# Patient Record
Sex: Male | Born: 1950 | Race: White | Hispanic: No | Smoking: Former smoker
Health system: Southern US, Community
[De-identification: ages and names within clinical notes are randomized; demographics above are authoritative.]

## PROBLEM LIST (undated history)

## (undated) DIAGNOSIS — E7800 Pure hypercholesterolemia, unspecified: Secondary | ICD-10-CM

## (undated) DIAGNOSIS — K219 Gastro-esophageal reflux disease without esophagitis: Secondary | ICD-10-CM

## (undated) DIAGNOSIS — I739 Peripheral vascular disease, unspecified: Secondary | ICD-10-CM

## (undated) DIAGNOSIS — M1990 Unspecified osteoarthritis, unspecified site: Secondary | ICD-10-CM

## (undated) DIAGNOSIS — L719 Rosacea, unspecified: Secondary | ICD-10-CM

## (undated) DIAGNOSIS — I10 Essential (primary) hypertension: Secondary | ICD-10-CM

## (undated) DIAGNOSIS — E119 Type 2 diabetes mellitus without complications: Secondary | ICD-10-CM

## (undated) DIAGNOSIS — Z8719 Personal history of other diseases of the digestive system: Secondary | ICD-10-CM

## (undated) DIAGNOSIS — T7840XA Allergy, unspecified, initial encounter: Secondary | ICD-10-CM

## (undated) DIAGNOSIS — M810 Age-related osteoporosis without current pathological fracture: Secondary | ICD-10-CM

## (undated) HISTORY — PX: BACK SURGERY: SHX140

## (undated) HISTORY — PX: CERVICAL SPINE SURGERY: SHX589

## (undated) HISTORY — DX: Allergy, unspecified, initial encounter: T78.40XA

## (undated) HISTORY — DX: Rosacea, unspecified: L71.9

## (undated) HISTORY — PX: COLONOSCOPY: SHX5424

## (undated) HISTORY — PX: ESOPHAGOGASTRODUODENOSCOPY: SHX1529

## (undated) HISTORY — DX: Peripheral vascular disease, unspecified: I73.9

---

## 2000-10-13 ENCOUNTER — Encounter: Payer: Self-pay | Admitting: Family Medicine

## 2000-10-13 ENCOUNTER — Encounter: Admission: RE | Admit: 2000-10-13 | Discharge: 2000-10-13 | Payer: Self-pay | Admitting: Family Medicine

## 2001-07-13 ENCOUNTER — Encounter: Payer: Self-pay | Admitting: Family Medicine

## 2001-07-13 ENCOUNTER — Encounter: Admission: RE | Admit: 2001-07-13 | Discharge: 2001-07-13 | Payer: Self-pay | Admitting: Family Medicine

## 2002-09-25 ENCOUNTER — Ambulatory Visit (HOSPITAL_COMMUNITY): Admission: RE | Admit: 2002-09-25 | Discharge: 2002-09-25 | Payer: Self-pay | Admitting: *Deleted

## 2002-09-25 ENCOUNTER — Encounter (INDEPENDENT_AMBULATORY_CARE_PROVIDER_SITE_OTHER): Payer: Self-pay | Admitting: Specialist

## 2003-06-19 ENCOUNTER — Encounter (INDEPENDENT_AMBULATORY_CARE_PROVIDER_SITE_OTHER): Payer: Self-pay | Admitting: Internal Medicine

## 2003-06-19 LAB — CONVERTED CEMR LAB: Hgb A1c MFr Bld: 8 %

## 2003-07-19 ENCOUNTER — Encounter (INDEPENDENT_AMBULATORY_CARE_PROVIDER_SITE_OTHER): Payer: Self-pay | Admitting: Internal Medicine

## 2003-10-19 ENCOUNTER — Encounter (INDEPENDENT_AMBULATORY_CARE_PROVIDER_SITE_OTHER): Payer: Self-pay | Admitting: Internal Medicine

## 2003-10-19 LAB — CONVERTED CEMR LAB: Hgb A1c MFr Bld: 5.6 %

## 2004-04-18 ENCOUNTER — Encounter (INDEPENDENT_AMBULATORY_CARE_PROVIDER_SITE_OTHER): Payer: Self-pay | Admitting: Internal Medicine

## 2004-05-14 ENCOUNTER — Ambulatory Visit: Payer: Self-pay | Admitting: Family Medicine

## 2004-05-14 ENCOUNTER — Encounter (INDEPENDENT_AMBULATORY_CARE_PROVIDER_SITE_OTHER): Payer: Self-pay | Admitting: Internal Medicine

## 2004-05-14 LAB — CONVERTED CEMR LAB: PSA: 0.35 ng/mL

## 2004-06-05 ENCOUNTER — Ambulatory Visit: Payer: Self-pay | Admitting: Internal Medicine

## 2004-09-30 ENCOUNTER — Ambulatory Visit: Payer: Self-pay | Admitting: Family Medicine

## 2004-09-30 ENCOUNTER — Encounter: Admission: RE | Admit: 2004-09-30 | Discharge: 2004-09-30 | Payer: Self-pay | Admitting: Family Medicine

## 2004-10-08 ENCOUNTER — Encounter: Admission: RE | Admit: 2004-10-08 | Discharge: 2004-10-08 | Payer: Self-pay | Admitting: Family Medicine

## 2004-10-09 ENCOUNTER — Encounter: Admission: RE | Admit: 2004-10-09 | Discharge: 2004-10-09 | Payer: Self-pay | Admitting: Family Medicine

## 2004-10-18 ENCOUNTER — Encounter (INDEPENDENT_AMBULATORY_CARE_PROVIDER_SITE_OTHER): Payer: Self-pay | Admitting: Internal Medicine

## 2004-11-12 ENCOUNTER — Ambulatory Visit: Payer: Self-pay | Admitting: Family Medicine

## 2004-12-01 ENCOUNTER — Ambulatory Visit: Payer: Self-pay | Admitting: Family Medicine

## 2005-04-18 ENCOUNTER — Encounter (INDEPENDENT_AMBULATORY_CARE_PROVIDER_SITE_OTHER): Payer: Self-pay | Admitting: Internal Medicine

## 2005-04-18 LAB — CONVERTED CEMR LAB: Hgb A1c MFr Bld: 5.9 %

## 2005-05-03 ENCOUNTER — Ambulatory Visit: Payer: Self-pay | Admitting: Family Medicine

## 2005-08-05 ENCOUNTER — Ambulatory Visit (HOSPITAL_COMMUNITY): Admission: RE | Admit: 2005-08-05 | Discharge: 2005-08-06 | Payer: Self-pay | Admitting: Neurosurgery

## 2005-10-04 ENCOUNTER — Inpatient Hospital Stay (HOSPITAL_COMMUNITY): Admission: RE | Admit: 2005-10-04 | Discharge: 2005-10-08 | Payer: Self-pay | Admitting: Neurosurgery

## 2005-10-18 ENCOUNTER — Encounter (INDEPENDENT_AMBULATORY_CARE_PROVIDER_SITE_OTHER): Payer: Self-pay | Admitting: Internal Medicine

## 2005-10-21 ENCOUNTER — Ambulatory Visit: Payer: Self-pay | Admitting: Family Medicine

## 2006-01-03 ENCOUNTER — Ambulatory Visit: Payer: Self-pay | Admitting: Family Medicine

## 2006-05-10 ENCOUNTER — Ambulatory Visit: Payer: Self-pay | Admitting: Internal Medicine

## 2006-05-10 LAB — CONVERTED CEMR LAB
ALT: 47 units/L — ABNORMAL HIGH (ref 0–40)
AST: 41 units/L — ABNORMAL HIGH (ref 0–37)
BUN: 12 mg/dL (ref 6–23)
Chloride: 106 meq/L (ref 96–112)
Cholesterol: 209 mg/dL (ref 0–200)
Creatinine,U: 170.8 mg/dL
Direct LDL: 133.7 mg/dL
GFR calc Af Amer: 74 mL/min
GFR calc non Af Amer: 61 mL/min
Glucose, Bld: 151 mg/dL — ABNORMAL HIGH (ref 70–99)
Hgb A1c MFr Bld: 8.2 %
Microalb Creat Ratio: 5.9 mg/g (ref 0.0–30.0)
Potassium: 4.7 meq/L (ref 3.5–5.1)
Sodium: 141 meq/L (ref 135–145)
Triglycerides: 302 mg/dL (ref 0–149)
VLDL: 60 mg/dL — ABNORMAL HIGH (ref 0–40)

## 2006-05-11 ENCOUNTER — Ambulatory Visit: Payer: Self-pay | Admitting: Family Medicine

## 2006-05-12 ENCOUNTER — Ambulatory Visit: Payer: Self-pay | Admitting: Internal Medicine

## 2006-05-26 ENCOUNTER — Ambulatory Visit: Payer: Self-pay | Admitting: Family Medicine

## 2006-05-26 DIAGNOSIS — M810 Age-related osteoporosis without current pathological fracture: Secondary | ICD-10-CM | POA: Insufficient documentation

## 2006-06-15 ENCOUNTER — Telehealth (INDEPENDENT_AMBULATORY_CARE_PROVIDER_SITE_OTHER): Payer: Self-pay | Admitting: *Deleted

## 2006-07-11 ENCOUNTER — Encounter (INDEPENDENT_AMBULATORY_CARE_PROVIDER_SITE_OTHER): Payer: Self-pay | Admitting: Internal Medicine

## 2006-07-11 DIAGNOSIS — E119 Type 2 diabetes mellitus without complications: Secondary | ICD-10-CM

## 2006-07-11 DIAGNOSIS — M5430 Sciatica, unspecified side: Secondary | ICD-10-CM

## 2006-07-11 DIAGNOSIS — E785 Hyperlipidemia, unspecified: Secondary | ICD-10-CM

## 2006-07-12 ENCOUNTER — Ambulatory Visit: Payer: Self-pay | Admitting: Family Medicine

## 2006-07-12 LAB — CONVERTED CEMR LAB
Creatinine,U: 120.3 mg/dL
Microalb Creat Ratio: 5.8 mg/g (ref 0.0–30.0)

## 2006-07-20 ENCOUNTER — Telehealth (INDEPENDENT_AMBULATORY_CARE_PROVIDER_SITE_OTHER): Payer: Self-pay | Admitting: Internal Medicine

## 2006-09-01 ENCOUNTER — Ambulatory Visit: Payer: Self-pay | Admitting: Family Medicine

## 2006-09-01 DIAGNOSIS — H6980 Other specified disorders of Eustachian tube, unspecified ear: Secondary | ICD-10-CM

## 2006-10-17 ENCOUNTER — Ambulatory Visit: Payer: Self-pay | Admitting: Internal Medicine

## 2006-11-29 ENCOUNTER — Encounter (INDEPENDENT_AMBULATORY_CARE_PROVIDER_SITE_OTHER): Payer: Self-pay | Admitting: Internal Medicine

## 2006-12-02 ENCOUNTER — Ambulatory Visit: Payer: Self-pay | Admitting: Family Medicine

## 2007-01-02 ENCOUNTER — Ambulatory Visit: Payer: Self-pay | Admitting: Family Medicine

## 2007-01-05 LAB — CONVERTED CEMR LAB
Cholesterol: 197 mg/dL (ref 0–200)
HDL: 25.7 mg/dL — ABNORMAL LOW (ref >39.0)
Hgb A1c MFr Bld: 6.4 % — ABNORMAL HIGH (ref 4.6–6.0)
LDL Cholesterol: 142 mg/dL — ABNORMAL HIGH (ref 0–99)
VLDL: 29 mg/dL (ref 0–40)

## 2007-01-09 ENCOUNTER — Ambulatory Visit: Payer: Self-pay | Admitting: Family Medicine

## 2007-01-09 DIAGNOSIS — E669 Obesity, unspecified: Secondary | ICD-10-CM | POA: Insufficient documentation

## 2007-02-10 ENCOUNTER — Ambulatory Visit: Payer: Self-pay | Admitting: Family Medicine

## 2007-02-15 ENCOUNTER — Ambulatory Visit: Payer: Self-pay | Admitting: Family Medicine

## 2007-02-15 LAB — CONVERTED CEMR LAB
AST: 31 units/L (ref 0–37)
Cholesterol: 124 mg/dL (ref 0–200)
LDL Cholesterol: 75 mg/dL (ref 0–99)
Total CHOL/HDL Ratio: 4.4
Triglycerides: 105 mg/dL (ref 0–149)

## 2007-05-17 ENCOUNTER — Ambulatory Visit: Payer: Self-pay | Admitting: Family Medicine

## 2007-05-18 LAB — CONVERTED CEMR LAB
Cholesterol: 138 mg/dL (ref 0–200)
LDL Cholesterol: 84 mg/dL (ref 0–99)
Triglycerides: 153 mg/dL — ABNORMAL HIGH (ref 0–149)
VLDL: 31 mg/dL (ref 0–40)

## 2007-05-23 ENCOUNTER — Ambulatory Visit: Payer: Self-pay | Admitting: Family Medicine

## 2007-05-23 DIAGNOSIS — T887XXA Unspecified adverse effect of drug or medicament, initial encounter: Secondary | ICD-10-CM

## 2007-05-23 DIAGNOSIS — H9190 Unspecified hearing loss, unspecified ear: Secondary | ICD-10-CM | POA: Insufficient documentation

## 2007-05-30 ENCOUNTER — Encounter (INDEPENDENT_AMBULATORY_CARE_PROVIDER_SITE_OTHER): Payer: Self-pay | Admitting: Internal Medicine

## 2007-06-26 ENCOUNTER — Ambulatory Visit: Payer: Self-pay | Admitting: Family Medicine

## 2007-06-26 DIAGNOSIS — M545 Low back pain, unspecified: Secondary | ICD-10-CM | POA: Insufficient documentation

## 2007-06-26 DIAGNOSIS — M542 Cervicalgia: Secondary | ICD-10-CM

## 2007-07-03 LAB — CONVERTED CEMR LAB
ALT: 48 units/L (ref 0–53)
Cholesterol: 197 mg/dL (ref 0–200)
Direct LDL: 131.5 mg/dL
Total CHOL/HDL Ratio: 7.5

## 2007-07-04 ENCOUNTER — Ambulatory Visit: Payer: Self-pay | Admitting: Family Medicine

## 2007-09-13 ENCOUNTER — Ambulatory Visit: Payer: Self-pay | Admitting: Family Medicine

## 2007-09-18 ENCOUNTER — Encounter (INDEPENDENT_AMBULATORY_CARE_PROVIDER_SITE_OTHER): Payer: Self-pay | Admitting: Internal Medicine

## 2007-09-18 LAB — CONVERTED CEMR LAB
Cholesterol: 174 mg/dL (ref 0–200)
Total CHOL/HDL Ratio: 5.8
Triglycerides: 130 mg/dL (ref 0–149)

## 2007-09-20 ENCOUNTER — Ambulatory Visit: Payer: Self-pay | Admitting: Family Medicine

## 2007-10-11 ENCOUNTER — Encounter (INDEPENDENT_AMBULATORY_CARE_PROVIDER_SITE_OTHER): Payer: Self-pay | Admitting: Internal Medicine

## 2007-10-18 ENCOUNTER — Ambulatory Visit: Payer: Self-pay | Admitting: Family Medicine

## 2007-12-19 ENCOUNTER — Ambulatory Visit: Payer: Self-pay | Admitting: Internal Medicine

## 2007-12-19 LAB — CONVERTED CEMR LAB
Cholesterol: 114 mg/dL (ref 0–200)
HDL: 27.8 mg/dL — ABNORMAL LOW (ref >39.0)
Hgb A1c MFr Bld: 7.2 % — ABNORMAL HIGH (ref 4.6–6.0)
LDL Cholesterol: 64 mg/dL (ref 0–99)
Total CHOL/HDL Ratio: 4.1
Triglycerides: 113 mg/dL (ref 0–149)
VLDL: 23 mg/dL (ref 0–40)

## 2007-12-22 ENCOUNTER — Ambulatory Visit: Payer: Self-pay | Admitting: Internal Medicine

## 2007-12-22 DIAGNOSIS — R252 Cramp and spasm: Secondary | ICD-10-CM

## 2007-12-25 LAB — CONVERTED CEMR LAB
CO2: 31 meq/L (ref 19–32)
Calcium: 9.7 mg/dL (ref 8.4–10.5)
Chloride: 105 meq/L (ref 96–112)
Sodium: 141 meq/L (ref 135–145)

## 2008-02-01 ENCOUNTER — Telehealth (INDEPENDENT_AMBULATORY_CARE_PROVIDER_SITE_OTHER): Payer: Self-pay | Admitting: *Deleted

## 2008-02-01 ENCOUNTER — Encounter (INDEPENDENT_AMBULATORY_CARE_PROVIDER_SITE_OTHER): Payer: Self-pay | Admitting: Internal Medicine

## 2008-02-21 ENCOUNTER — Ambulatory Visit: Payer: Self-pay | Admitting: Family Medicine

## 2008-03-05 ENCOUNTER — Telehealth (INDEPENDENT_AMBULATORY_CARE_PROVIDER_SITE_OTHER): Payer: Self-pay | Admitting: Internal Medicine

## 2008-03-18 ENCOUNTER — Ambulatory Visit: Payer: Self-pay | Admitting: Family Medicine

## 2008-03-18 DIAGNOSIS — M25519 Pain in unspecified shoulder: Secondary | ICD-10-CM | POA: Insufficient documentation

## 2008-05-01 ENCOUNTER — Ambulatory Visit: Payer: Self-pay | Admitting: Family Medicine

## 2008-05-01 DIAGNOSIS — H60339 Swimmer's ear, unspecified ear: Secondary | ICD-10-CM

## 2008-05-28 ENCOUNTER — Encounter (INDEPENDENT_AMBULATORY_CARE_PROVIDER_SITE_OTHER): Payer: Self-pay | Admitting: Internal Medicine

## 2008-05-31 ENCOUNTER — Ambulatory Visit: Payer: Self-pay | Admitting: Family Medicine

## 2008-05-31 LAB — CONVERTED CEMR LAB
ALT: 35 units/L (ref 0–53)
HDL: 26.8 mg/dL — ABNORMAL LOW (ref >39.00)
LDL Cholesterol: 74 mg/dL (ref 0–99)
Total CHOL/HDL Ratio: 5
VLDL: 24.4 mg/dL (ref 0.0–40.0)

## 2008-06-04 ENCOUNTER — Ambulatory Visit: Payer: Self-pay | Admitting: Family Medicine

## 2008-06-04 DIAGNOSIS — I10 Essential (primary) hypertension: Secondary | ICD-10-CM | POA: Insufficient documentation

## 2008-06-05 ENCOUNTER — Encounter (INDEPENDENT_AMBULATORY_CARE_PROVIDER_SITE_OTHER): Payer: Self-pay | Admitting: Internal Medicine

## 2008-06-05 ENCOUNTER — Ambulatory Visit: Payer: Self-pay | Admitting: Internal Medicine

## 2008-06-14 ENCOUNTER — Encounter (INDEPENDENT_AMBULATORY_CARE_PROVIDER_SITE_OTHER): Payer: Self-pay | Admitting: Internal Medicine

## 2008-07-10 ENCOUNTER — Ambulatory Visit: Payer: Self-pay | Admitting: Family Medicine

## 2008-07-18 ENCOUNTER — Ambulatory Visit: Payer: Self-pay | Admitting: Family Medicine

## 2008-07-18 DIAGNOSIS — J020 Streptococcal pharyngitis: Secondary | ICD-10-CM

## 2008-07-18 DIAGNOSIS — H9209 Otalgia, unspecified ear: Secondary | ICD-10-CM | POA: Insufficient documentation

## 2008-07-18 LAB — CONVERTED CEMR LAB: Rapid Strep: POSITIVE

## 2008-07-19 ENCOUNTER — Telehealth (INDEPENDENT_AMBULATORY_CARE_PROVIDER_SITE_OTHER): Payer: Self-pay | Admitting: Internal Medicine

## 2008-08-09 ENCOUNTER — Ambulatory Visit: Payer: Self-pay | Admitting: Family Medicine

## 2008-09-09 ENCOUNTER — Ambulatory Visit: Payer: Self-pay | Admitting: Family Medicine

## 2008-09-11 LAB — CONVERTED CEMR LAB
ALT: 48 units/L (ref 0–53)
AST: 39 units/L — ABNORMAL HIGH (ref 0–37)
Cholesterol: 113 mg/dL (ref 0–200)
Creatinine,U: 93.8 mg/dL
HDL: 25.1 mg/dL — ABNORMAL LOW (ref >39.00)
Microalb Creat Ratio: 9.6 mg/g (ref 0.0–30.0)
Microalb, Ur: 0.9 mg/dL (ref 0.0–1.9)
Total CHOL/HDL Ratio: 5
VLDL: 29 mg/dL (ref 0.0–40.0)

## 2008-10-22 ENCOUNTER — Ambulatory Visit: Payer: Self-pay | Admitting: Family Medicine

## 2008-11-08 ENCOUNTER — Ambulatory Visit: Payer: Self-pay | Admitting: Family Medicine

## 2008-11-13 LAB — CONVERTED CEMR LAB: Hgb A1c MFr Bld: 6.2 % (ref 4.6–6.5)

## 2008-12-03 ENCOUNTER — Ambulatory Visit: Payer: Self-pay | Admitting: Family Medicine

## 2008-12-04 ENCOUNTER — Telehealth (INDEPENDENT_AMBULATORY_CARE_PROVIDER_SITE_OTHER): Payer: Self-pay | Admitting: Internal Medicine

## 2008-12-04 LAB — CONVERTED CEMR LAB
ALT: 38 units/L (ref 0–53)
HDL: 31.7 mg/dL — ABNORMAL LOW (ref >39.00)
Total CHOL/HDL Ratio: 6
Triglycerides: 196 mg/dL — ABNORMAL HIGH (ref 0.0–149.0)

## 2008-12-10 ENCOUNTER — Ambulatory Visit: Payer: Self-pay | Admitting: Family Medicine

## 2009-01-13 ENCOUNTER — Ambulatory Visit: Payer: Self-pay | Admitting: Family Medicine

## 2009-01-14 ENCOUNTER — Telehealth (INDEPENDENT_AMBULATORY_CARE_PROVIDER_SITE_OTHER): Payer: Self-pay | Admitting: Internal Medicine

## 2009-01-14 LAB — CONVERTED CEMR LAB
Alkaline Phosphatase: 59 units/L (ref 39–117)
Bilirubin, Direct: 0.1 mg/dL (ref 0.0–0.3)
Cholesterol: 175 mg/dL (ref 0–200)
HDL: 30 mg/dL — ABNORMAL LOW (ref >39.00)
Total CHOL/HDL Ratio: 6
Triglycerides: 220 mg/dL — ABNORMAL HIGH (ref 0.0–149.0)

## 2009-01-23 ENCOUNTER — Ambulatory Visit: Payer: Self-pay | Admitting: Internal Medicine

## 2009-03-24 ENCOUNTER — Telehealth (INDEPENDENT_AMBULATORY_CARE_PROVIDER_SITE_OTHER): Payer: Self-pay | Admitting: Cardiology

## 2009-06-03 ENCOUNTER — Encounter: Payer: Self-pay | Admitting: Family Medicine

## 2010-02-17 NOTE — Progress Notes (Signed)
Summary: leg cramping and difficulty in putting on socks d/t crestor  Phone Note Call from Patient   Caller: Patient Call For: lipid clinic Summary of Call: leg cramping started 3 days after starting on crestor 2.5 mg daily.  continued until 3/4 then held until today--feels much better today.   Initial call taken by: Shelby Dubin PharmD, BCPS, CPP,  March 24, 2009 9:18 AM  Follow-up for Phone Call        Offered 2.5 mg dose 2 times weekly.  Patient hesitant, but agrees to try.  Verbalizes willingness--try 2.5 mg tomorrow, then plan to talk on Friday to see how feeling.  Precautions to patient who agrees to call with worsening symptoms or changes..  May need to try co-q-10 too..mp Follow-up by: Shelby Dubin PharmD, BCPS, CPP,  March 24, 2009 9:18 AM    New/Updated Medications: CRESTOR 5 MG TABS (ROSUVASTATIN CALCIUM) Take one  half tablet by mouth each Tuesday and Friday.

## 2010-02-17 NOTE — Assessment & Plan Note (Signed)
Summary: NEW TO LIPID/HYPERLIPIDEMIA   Primary Provider:  Gildardo Griffes FNP   History of Present Illness:       This is a 60 year old male who presents for evaluation in Lipid Clinic.  The patient presents with history of hyperlipidemia, hypertension, diabetes, and smoking - he quit 7 years ago.  The patient is compliant with medication-fair.    fenofibrate - leg muscle burning pravastatin  - leg  muscle burning simvastatin - leg muscle burning metformin - leg muscle burning - on prior to statin or fenofibrate   Preventive Screening-Counseling & Management  Alcohol-Tobacco     Alcohol drinks/day: quit in 1997     Smoking Status: quit > 6 months  Caffeine-Diet-Exercise     Caffeine use/day: 3 diet dr pepper /day     Does Patient Exercise: yes     Type of exercise: walk     Exercise (avg: min/session): 1 mile daily 3 minle 2x week  Current Medications (verified): 1)  Metrogel  Gel (Metronidazole Gel) .... Apply Two Times A Day 2)  Hydrocodone-Acetaminophen 10-500 Mg Tabs (Hydrocodone-Acetaminophen) .Marland Kitchen.. 1 By Mouth Every 4 Hours As Needed By Mouth 3)  Flexeril 10 Mg Tabs (Cyclobenzaprine Hcl) .Marland Kitchen.. 1 Three Times A Day As Needed By Mouth 4)  Vitamin D3 1000 Unit Tabs (Cholecalciferol) .Marland Kitchen.. 1 Daily By Mouth 5)  Calcium 600/vitamin D 600-400 Mg-Unit  Tabs (Calcium Carbonate-Vitamin D) .... Take 1 Tablet By Mouth Two Times A Day 6)  Cvs Natural Fish Oil 1000 Mg  Caps (Omega-3 Fatty Acids) .... Take 1 Tablet By Mouth Three Times A Day 7)  Omeprazole 40 Mg Cpdr (Omeprazole) .Marland Kitchen.. 1 Each Morning With Water, Wait 30-60 Min To Eat or Drink 8)  Ramipril 2.5 Mg Caps (Ramipril) .Marland Kitchen.. 1 Once Daily For Bp By Mouth 9)  Freestyle Test Strips .... Use Daily To Monitor Diabetes 10)  Crestor 5 Mg Tabs (Rosuvastatin Calcium) .... Take One  Half Tablet By Mouth Daily.  Allergies (verified): 1)  ! Amoxicillin 2)  ! Pcn 3)  ! * Cortisporin Ear Gtts 4)  ! Percocet 5)  ! Keflex 6)  !  Ultram 7)  ! Celebrex 8)  ! * Actonel 9)  ! Actonel 10)  ! * Lisinopril  Social History: Caffeine use/day:  3 diet dr pepper /day Alcohol drinks/day:  quit in 1997 Smoking Status:  quit > 6 months Does Patient Exercise:  yes   Vital Signs:  Patient profile:   60 year old male Height:      69 inches Weight:      197 pounds Pulse rate:   80 / minute Pulse rhythm:   regular BP sitting:   158 / 96  (left arm) Cuff size:   large  Impression & Recommendations:  Problem # 1:  HYPERLIPIDEMIA (ICD-272.4)  The following medications were removed from the medication list:    Fenofibrate Micronized 134 Mg Caps (Fenofibrate micronized) .Marland Kitchen... Take 1 each day for cholesterol - d/c     Pravachol 40 Mg Tabs (Pravastatin sodium) .Marland Kitchen... Take 1 each day for cholesterol - d/c His updated medication list for this problem includes:    Crestor 5 Mg Tabs (Rosuvastatin calcium) .Marland Kitchen... Take one  half tablet by mouth daily. - start today  This is Mr Thorstenson's first visit to Lipid Clinic.  He has had many statin intolerences in the past.  All causing leg burning - not muscle cramps or pains.   He does have chrronic back  pain and DM.  I wounder if leg burning is assoc with these problems and less likely stain intol.   He exercises daily by walking 1 mile on the tredmill in .  He eats mostly low fat meals with some snacking inbetween.  His biggest problem is red meat almost daily and a bag of popcorn daily.  He is non-compliant with meds - and has been off lipid meds and ACEI and DM meds for a month.  He states metformin caused leg burning as did the statins.  He is now on no meds for DM.  I ahve asked that he return to his primary MD for follow up and treatment for DM...as we will not be able to control TG without DM control.    TC  175 at goal < 200   TG 220 > goal < 150   HDL 30 < goal > 40   LDL 104 > goal < 70  Plan - increase walking 3 min 2-3 days a week. - decrease snacks and popcorn by half -  begin crestor 2.5mg  once daily   - f/u 2 months Prescriptions: RAMIPRIL 2.5 MG CAPS (RAMIPRIL) 1 once daily for BP by mouth  #90 x 3   Entered by:   Leota Sauers, PharmD, BCPS, CPP   Authorized by:   Gaylord Shih, MD, [facility]   Signed by:   Leota Sauers, PharmD, BCPS, CPP on 01/23/2009   Method used:   Electronically to        [facility]  [address] [id number]       [address]       [place], [place]  [address]       Ph: [phone]       Fax: [phone]   RxID:   [id number] CRESTOR 5 MG TABS (ROSUVASTATIN CALCIUM) Take one  half tablet by mouth daily.  #15 x 3   Entered by:   Leota Sauers, PharmD, BCPS, CPP   Authorized by:   Gaylord Shih, MD, [facility]   Signed by:   Leota Sauers, PharmD, BCPS, CPP on 01/23/2009   Method used:   [facility] to        [id number]       [address]       [place], [place]  [address]       Ph: [phone]       Fax: [phone]   RxID:   [id number]

## 2010-02-20 NOTE — Letter (Signed)
Summary: [facility]  [facility]   Imported By: Lanelle Bal 06/13/2009 12:17:42  _____________________________________________________________________  External Attachment:    Type:   Image     Comment:   External Document

## 2010-06-05 NOTE — Op Note (Signed)
   NAME:  Blake Miller, Blake Miller                            ACCOUNT NO.:  [id number]   MEDICAL RECORD NO.:  [id number]                   PATIENT TYPE:  AMB   LOCATION:  ENDO                                 FACILITY:  [facility]   PHYSICIAN:  Georgiana Spinner, M.D.                 DATE OF BIRTH:  1950-03-11   DATE OF PROCEDURE:  09/25/2002  DATE OF DISCHARGE:                                 OPERATIVE REPORT   PROCEDURE:  Endoscopy.   INDICATIONS:  GERD with history of Barrett's esophagus.   ANESTHESIA:  Demerol 80 mg, Versed 8 mg.   DESCRIPTION OF PROCEDURE:  With the patient mildly sedated in the left  lateral decubitus position, the Olympus videoscopic endoscope was inserted  in the mouth, passed under direct vision through the esophagus, which  appeared normal.  The distal esophagus, however, was not well-visualized due  to spasm.  There were areas that could conceivably be Barrett's but could  not be well-seen.  They were photographed and biopsies were taken from this  area.  We entered into the stomach.  Fundus, body, antrum appeared normal.  The duodenal bulb showed erythema consistent with duodenitis.  The second  portion of the duodenum appeared normal.  From this point the endoscope was  then slowly withdrawn, taking circumferential views of the duodenal mucosa  until the endoscope had been pulled back into the stomach, placed into  retroflexion to view the stomach from below.  The endoscope was then  straightened and withdrawn, taking circumferential views of the remaining  gastric and esophageal mucosa.  The patient's vital signs and pulse oximetry  remained stable.  The patient tolerated the procedure well with no apparent  complications.   FINDINGS:  Duodenitis with question of Barrett's esophagus also seen.   PLAN:  Await biopsy report.  The patient will call me for results and follow  up with me as an outpatient.                                               Georgiana Spinner,  M.D.    GMO/MEDQ  D:  09/25/2002  T:  09/25/2002  Job:  [id number]   cc:   [facility], P.[facility].-C.

## 2010-06-05 NOTE — Op Note (Signed)
NAME:  Blake Miller, Blake Miller                  ACCOUNT NO.:  [id number]   MEDICAL RECORD NO.:  [id number]          PATIENT TYPE:  INP   LOCATION:  3012                         FACILITY:  [facility]   PHYSICIAN:  Cristi Loron, M.D.DATE OF BIRTH:  10-21-1950   DATE OF PROCEDURE:  DATE OF DISCHARGE:                                 OPERATIVE REPORT   ADDENDUM:   SURGEON:  Cristi Loron, M.D.   NOTE:  I forgot to mention that there was severe foraminal stenosis at L4-5  bilaterally, with compression of the bilateral exiting L4 nerve roots.  I  performed an extensive foraminotomy about the L4 nerve roots bilaterally,  decompressing the nerve well.      Cristi Loron, M.D.  Electronically Signed     JDJ/MEDQ  D:  10/05/2005  T:  10/05/2005  Job:  [id number]

## 2010-06-05 NOTE — Discharge Summary (Signed)
NAME:  Blake Miller, LINAREZ                  ACCOUNT NO.:  [id number]   MEDICAL RECORD NO.:  [id number]          PATIENT TYPE:  INP   LOCATION:  3012                         FACILITY:  [facility]   PHYSICIAN:  Cristi Loron, M.D.DATE OF BIRTH:  12-Sep-1950   DATE OF ADMISSION:  10/04/2005  DATE OF DISCHARGE:  10/08/2005                                 DISCHARGE SUMMARY   BRIEF HISTORY:  The patient is a 60 year old white male who suffered from  chronic back and leg pain.  He failed medical management and was worked up  with a lumbar MRI, which demonstrated he had a spondylolisthesis at L4-5  with a transitional-type vertebra, which I will name L5, with a resultant  severe spinal stenosis.  I discussed the various treatment options with the  patient, including surgery.  He is aware of the risk, benefits, and  alternatives to surgery and decided to proceed with a lumbar decompression  and fusion.   For further details of this admission, please refer to the type history and  physical.   HOSPITAL COURSE:  I admitted the patient to [facility] on October 04, 2005.  On the day of admission, I performed at L4-5 decompression and  fusion.  The surgery went well and for further details of this operation,  please refer to typed operative report.   The patient's postoperative course was remarkable for only he had a fever to  101.9.  A chest x-ray, UA, CBC, etc. all turned out okay, except for some  atelectasis on the chest x-ray.  The patient was discharged home on  October 08, 2005 at his request.  At that time he was afebrile, vital  signs were stable, he was eating well, and ambulating well.   DISCHARGE INSTRUCTIONS:  The patient was given written discharge  instructions and instructed to follow up with me in 4 weeks.   DISCHARGE MEDICATIONS:  1. Lortab 10, #100, one p.o. q.4 hours p.r.n. for pain.  2. Valium 5/50 mg one p.o. q.6 hours p.r.n. for muscle spasms.   FINAL DIAGNOSIS:  1. L4-5 grade 1 spondylolisthesis; L4 spondylolysis; spinal stenosis;      lumbar radiculopathy; lumbago.  2. Atelectasis.  3. Fever.   PROCEDURE PERFORMED:  L4 Gill procedure; bilateral L3 laminotomies, L4-5  posterior non-segmental instrumentation with Legacy titanium pedicle screws  and rods; L4-4 posterolateral arthrodesis, local morcellized autologous bone  and Vitoss bone graft extender.      Cristi Loron, M.D.  Electronically Signed    JDJ/MEDQ  D:  10/29/2005  T:  10/29/2005  Job:  [id number]

## 2010-06-05 NOTE — Op Note (Signed)
NAME:  Blake Miller, Blake Miller                  ACCOUNT NO.:  [id number]   MEDICAL RECORD NO.:  [id number]          PATIENT TYPE:  INP   LOCATION:  3012                         FACILITY:  [facility]   PHYSICIAN:  Cristi Loron, M.D.DATE OF BIRTH:  07-03-1950   DATE OF PROCEDURE:  10/04/2005  DATE OF DISCHARGE:                                 OPERATIVE REPORT   BRIEF HISTORY:  The patient is a 60 year old white male who has suffered  from chronic back and leg pain.  He failed medical management and was worked  up with a lumbar MRI which demonstrated he had a spondylolisthesis at L4-L5  (with a transitional type vertebra which I will name L5) with resultant  severe spinal stenosis.  I discussed the various treatment options with the  patient including surgery. He has weighed the risks, benefits and  alternatives of surgery and decided to proceed with a lumbar decompression  and fusion.   PREOPERATIVE DIAGNOSIS:  L4-L5 grade 1 spondylolisthesis, L4 spondylolysis,  spinal stenosis, lumbar radiculopathy, lumbago.   POSTOPERATIVE DIAGNOSIS:  L4-L5 grade 1 spondylolisthesis, L4 spondylolysis,  spinal stenosis, lumbar radiculopathy, lumbago.   PROCEDURE:  L4 Gill procedure to decompress the L4-L5 interspace; bilateral  L3 laminotomies to decompress L3-L4 lateral recesses; L4-L5 posterior non-  segmental fixation with Legacy titanium pedicle screws and rods; L4-L5  posterolateral arthrodesis with local morselized autograft bone and VITOSS  bone graft extender.   SURGEON:  Cristi Loron, M.D.   ASSISTANT:  Clydene Fake, M.D.   ANESTHESIA:  General endotracheal anesthesia.   ESTIMATED BLOOD LOSS:  200 mL.   SPECIMENS:  None.   DRAINS:  None.   COMPLICATIONS:  None.   DESCRIPTION OF PROCEDURE:  The patient was brought to the operating room by  the anesthesia team.  General endotracheal anesthesia was induced.  The  patient was then turned to the prone position on the Wilson frame.   His  lumbosacral region was then shaved with clippers and prepared with Betadine  scrub and Betadine solution.  Sterile drapes were applied.  I then injected  the area to be incised with Marcaine with epinephrine solution. I used a  scalpel to make a linear midline incision over the L4-L5 interspace.  I used  electrocautery to perform bilateral subperiosteal dissection exposing the  spinous process and lamina of L3, L4 and L5.  We obtained an interoperative  radiograph to confirm our location.   We then inserted the [facility] retractor for exposure.  We began by  incising the L3-L4 and L4-L5 interspinous ligament.  There was a clear L4  pars defect with a free-floating L4 lamina.  I used the Leksell rongeur to  remove the L4 spinous process and part of the L4 lamina as well as the  anterior facets at L4.  We saved this bone and later cleared it of soft  tissue, morselized it, and used it in the fusion process.  We then performed  a foraminotomy about the bilateral L5 nerve root completing the  decompression at that level.  I inspected the intervertebral disk at  L4-L5.  It was quite collapsed and spondylitic and there did not be appear to be any  way to get into this disk space in order to perform a posterior lumbar  fusion.  In order to decompress the proximal L3 nerve root at the L3-L4  interspace, I performed a bilateral laminotomy at L3 and performed a  foraminotomy about the bilateral L3 nerve root further decompressing the  nerve nerves and thecal sac.   We now turned our attention to posterior non-segmental instrumentation.  Under fluoroscopic guidance, I cannulated the bilateral L4 and L5 pedicle  with a bone probe.  I tapped the pedicle with a 5.5 mm tap, then probed  inside the tapped pedicle to rule out cortical breaches.  I then inserted  6.5 x 55 mm pedicle screws bilaterally at L4 and 6.5 by 50 mm bilaterally at  L5 under fluoroscopic guidance.  I then palpated along the  medial aspect of  the L4 and L5 nerve roots and noted that there was no cortical breeches and  the nerve root was well decompressed.  We then connected the unilateral  pedicle screw for the lordotic rod.  We then connected with the caps  completing the instrument instrumentation.   We now turned attention to posterolateral arthrodesis.  I used a high-speed  drill to decorticate the remainder of the L5 superior facet of the  transverse processes and the remainder of the L4 transverse processes  bilaterally.  We then laid a combination of VITOSS and local morselized  autograft bone over these decorticated posterolateral structures completing  the posterolateral arthrodesis.   We then obtained hemostasis using bipolar cautery.  We inspected the dura  and the bilateral L3 and L nerve roots and noted them to be well  decompressed.  We then removed the retractor and then reapproximated the  patient's thoracolumbar fascia with interrupted #1 Vicryl suture, the  subcutaneous tissues with interrupted 2-0 Vicryl suture, and the skin with  Steri-Strips and Benzoin.  The wound was then coated with bacitracin  ointment, a sterile dressing applied, the drapes were removed.  The patient  was subsequently returned to the supine position where he was extubated by  the anesthesia team and transported to the post anesthesia care unit in  stable condition.  All sponge, instrument and needle counts were correct at  the end of the case.      Cristi Loron, M.D.  Electronically Signed     JDJ/MEDQ  D:  10/05/2005  T:  10/06/2005  Job:  [id number]

## 2010-06-05 NOTE — Op Note (Signed)
NAME:  ALVON, NYGAARD                  ACCOUNT NO.:  [id number]   MEDICAL RECORD NO.:  [id number]          PATIENT TYPE:  INP   LOCATION:  3038                         FACILITY:  [facility]   PHYSICIAN:  Cristi Loron, M.D.DATE OF BIRTH:  1950-07-28   DATE OF PROCEDURE:  08/05/2005  DATE OF DISCHARGE:  08/06/2005                                 OPERATIVE REPORT   BRIEF HISTORY:  The patient is a 60 year old white male who has suffered  from neck and arm pain.  He failed medical management and was worked up with  a cervical MRI, which demonstrated he had multilevel degeneration,  spondylosis and stenosis, etc.  I discussed various treatment with him  including surgery.  The patient has weighed the risks, benefits and  alternatives and decided to proceed with a C4-5, C5-6 and C6-7 anterior  cervical diskectomy, fusion and plating.   PREOPERATIVE DIAGNOSES:  C4-5, C5-6 and C6-7 disk degeneration, spondylosis,  stenosis, cervical radiculopathy, cervicalgia.   POSTOPERATIVE DIAGNOSES:  C4-5, C5-6 and C6-7 disk degeneration,  spondylosis, stenosis, cervical radiculopathy, cervicalgia.   PROCEDURE:  C4-5, C5-6, and C6-7 extensive anterior cervical  diskectomy/decompression; C4-5, C5-6 and C6-7 anterior interbody iliac crest  allograft arthrodesis; C4 to C7 anterior cervical instrumentation with  Codman titanium plate and screws.   SURGEON:  Cristi Loron, M.D.   ASSISTANT:  Payton Doughty, M.D.   ANESTHESIA:  General endotracheal.   ESTIMATED BLOOD LOSS:  100 mL.   SPECIMENS:  None.   DRAIN.:  None.   COMPLICATIONS:  None.   DESCRIPTION OF PROCEDURE:  The patient was brought to operating room by the  anesthesia team and general endotracheal anesthesia was induced.  The  patient remained in supine position.  A roll was placed under his shoulders  to place his neck in slight extension.  His anterior cervical region was  then prepared with Betadine scrub and Betadine solution and  sterile drapes  were applied.  I then injected the area to be incised with Marcaine with  epinephrine solution and used a scalpel to make a transverse incision in the  patient's left anterior neck.  I used the Metzenbaum scissors to divide the  platysma muscle and then to dissect medial to the sternocleidomastoid  muscle, jugular vein and carotid artery, carefully identifying the esophagus  and retracting it medially.  I then used the Kitner swabs to clear soft  tissue from the upper exposed intervertebral disk space.  I then inserted a  bent spinal needle into the disk space and then we obtained intraoperative  radiograph to confirm our location.   We then used electrocautery to detach the medial border of the longus colli  muscle bilaterally from C4-5, C5-6 and C6-7 intervertebral disk space.  I  then inserted the Caspar self-retaining retractor fir exposure.  We began at  C6-7.  I incised the C7 intervertebral disk and performed a partial  diskectomy using a pituitary forceps and the Carlens curettes.  I then  inserted distraction pins at C6 and C7 and distracted interspace and then  brought the operative  microscope into the field and under its magnification  and illumination completed the microdissection/decompression.  I used a high-  speed drill to decorticate the vertebral end plates at E4-5, drill away the  remainder of C6-7 intervertebral disk, remove some posterior spondylosis,and  to thin out the posterior longitudinal ligament.  I then performed  foraminotomies about the bilateral C7 nerve root,  completing the  decompression at this level.   I then repeated this procedure in an analogous fashion at C4-5 and C5-6,  completing the decompressions at these levels, performing foraminotomies  about the bilateral C5 and C6 nerve roots.   Having completed the decompression, we now turned attention to arthrodesis.  I obtained iliac crest tricortical allograft bone graft and  fashioned it to  these approximate dimensions:  6 mm height, 1 cm depth.  I inserted one bone  graft into the distracted C4-5 interspace, then distracted the C5-6  interspace and placed a second bone graft in there, then distracted C6-7  interspace and placed a third bone graft into the C6-7 interspace.  I then  removed the distraction screws and noted there was a good, snug fit of the  bone graft in each level.   We now turned attention to anterior spinal instrumentation.  I used the high-  speed drill to remove some ventral spondylosis from the vertebral endplates  at C4-5, C5-6 and C6-7 so that the plate would lay down flat.  I selected  appropriate length Codman Slim Lock anterior cervical plate and laid it  along the anterior aspect of the vertebral bodies from C4 down to C7.  I  then drilled two 14 mm holes at C4, C5, C6 and C7 and secured the plate to  the vertebral bodies by placing two 14 mm self-tapping screws at C4, C5, C6,  and C7.  I then obtained the intraoperative radiograph.  The upper plate and  screws looked good on x-ray but we could not see the lower plate and screws  well because of this patient's shoulders, but they looked good in vivo.  We  therefore secured the screws to the plate by locking each cam.  This  completed the instrumentation.   I then irrigated the wound out with bacitracin solution.  I obtained  hemostasis using bipolar cautery.  I removed the Caspar self-retaining  retractor and inspected the esophagus for any damage.  There was none  apparent.  I then reapproximated the patient's platysma muscle with  interrupted 3-0 Vicryl suture, the subcutaneous tissue with a 3-0 Vicryl  suture and skin with Steri-Strips and Benzoin.  The wound was then coated  with bacitracin ointment and a sterile dressing applied.  The drapes were  removed.  The patient was subsequently extubated by the anesthesia team and transported to the Post Anesthesia Care Unit in  stable condition.  All  sponge, instrument and needle counts correct at the end of this case.      Cristi Loron, M.D.  Electronically Signed     JDJ/MEDQ  D:  08/06/2005  T:  08/07/2005  Job:  [id number]

## 2012-02-22 ENCOUNTER — Encounter (HOSPITAL_COMMUNITY): Payer: Self-pay | Admitting: *Deleted

## 2012-02-22 ENCOUNTER — Emergency Department (HOSPITAL_COMMUNITY)
Admission: EM | Admit: 2012-02-22 | Discharge: 2012-02-22 | Disposition: A | Discharge status: Home / Self Care | Payer: No Typology Code available for payment source | Attending: Emergency Medicine | Admitting: Emergency Medicine

## 2012-02-22 ENCOUNTER — Emergency Department (HOSPITAL_COMMUNITY): Payer: No Typology Code available for payment source

## 2012-02-22 DIAGNOSIS — I10 Essential (primary) hypertension: Secondary | ICD-10-CM | POA: Insufficient documentation

## 2012-02-22 DIAGNOSIS — Y9389 Activity, other specified: Secondary | ICD-10-CM | POA: Insufficient documentation

## 2012-02-22 DIAGNOSIS — IMO0002 Reserved for concepts with insufficient information to code with codable children: Secondary | ICD-10-CM | POA: Insufficient documentation

## 2012-02-22 DIAGNOSIS — M542 Cervicalgia: Secondary | ICD-10-CM

## 2012-02-22 DIAGNOSIS — Z87891 Personal history of nicotine dependence: Secondary | ICD-10-CM | POA: Insufficient documentation

## 2012-02-22 DIAGNOSIS — S199XXA Unspecified injury of neck, initial encounter: Secondary | ICD-10-CM | POA: Insufficient documentation

## 2012-02-22 DIAGNOSIS — Z79899 Other long term (current) drug therapy: Secondary | ICD-10-CM | POA: Insufficient documentation

## 2012-02-22 DIAGNOSIS — S0993XA Unspecified injury of face, initial encounter: Secondary | ICD-10-CM | POA: Insufficient documentation

## 2012-02-22 DIAGNOSIS — Z8739 Personal history of other diseases of the musculoskeletal system and connective tissue: Secondary | ICD-10-CM | POA: Insufficient documentation

## 2012-02-22 DIAGNOSIS — M549 Dorsalgia, unspecified: Secondary | ICD-10-CM

## 2012-02-22 DIAGNOSIS — E119 Type 2 diabetes mellitus without complications: Secondary | ICD-10-CM | POA: Insufficient documentation

## 2012-02-22 DIAGNOSIS — E7800 Pure hypercholesterolemia, unspecified: Secondary | ICD-10-CM | POA: Insufficient documentation

## 2012-02-22 DIAGNOSIS — Y92410 Unspecified street and highway as the place of occurrence of the external cause: Secondary | ICD-10-CM | POA: Insufficient documentation

## 2012-02-22 HISTORY — DX: Essential (primary) hypertension: I10

## 2012-02-22 HISTORY — DX: Age-related osteoporosis without current pathological fracture: M81.0

## 2012-02-22 HISTORY — DX: Type 2 diabetes mellitus without complications: E11.9

## 2012-02-22 HISTORY — DX: Unspecified osteoarthritis, unspecified site: M19.90

## 2012-02-22 HISTORY — DX: Pure hypercholesterolemia, unspecified: E78.00
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
## 2012-02-23 NOTE — ED Provider Notes (Signed)
Medical screening examination/treatment/procedure(s) were performed by non-physician practitioner and as supervising physician I was immediately available for consultation/collaboration.   Gwyneth Sprout, MD 02/23/12 2234

## 2012-09-06 ENCOUNTER — Other Ambulatory Visit: Payer: Self-pay | Admitting: Neurosurgery

## 2012-09-06 DIAGNOSIS — M542 Cervicalgia: Secondary | ICD-10-CM

## 2012-09-09 ENCOUNTER — Ambulatory Visit
Admission: RE | Admit: 2012-09-09 | Discharge: 2012-09-09 | Disposition: A | Discharge status: Home / Self Care | Payer: 59 | Source: Ambulatory Visit | Attending: Neurosurgery | Admitting: Neurosurgery

## 2012-09-09 DIAGNOSIS — M542 Cervicalgia: Secondary | ICD-10-CM

## 2012-10-13 ENCOUNTER — Other Ambulatory Visit: Payer: Self-pay | Admitting: Neurosurgery

## 2012-10-23 ENCOUNTER — Encounter (HOSPITAL_COMMUNITY): Payer: Self-pay | Admitting: Pharmacy Technician

## 2012-10-25 NOTE — Pre-Procedure Instructions (Signed)
Blake Miller  10/25/2012   Your procedure is scheduled on:  Oct. 16@0930  Report to [facility]  2 * 3 at 0630 AM.  Call this number if you have problems the morning of surgery: [phone]   Remember:   Do not eat food or drink liquids after midnight.   Take these medicines the morning of surgery with A SIP OF WATER:( Norco) Hydrocodone if neededand Omeprazole (Prilosec).   Stop taking Advil, Ibuprofen, Aleve, BC's, Goody's, Fish oil, Aspirin, Or Herbal medications.   Do not wear jewelry, make-up or nail polish.  Do not wear lotions, powders, or perfumes. You may wear deodorant.  Do not shave 48 hours prior to surgery. Men may shave face and neck.  Do not bring valuables to the hospital.  [facility] is not responsible                  for any belongings or valuables.               Contacts, dentures or bridgework may not be worn into surgery.  Leave suitcase in the car. After surgery it may be brought to your room.  For patients admitted to the hospital, discharge time is determined by your                treatment team.               Patients discharged the day of surgery will not be allowed to drive  home.    Special Instructions: Shower using CHG 2 nights before surgery and the night before surgery.  If you shower the day of surgery use CHG.  Use special wash - you have one bottle of CHG for all showers.  You should use approximately 1/3 of the bottle for each shower.   Please read over the following fact sheets that you were given: Pain Booklet, Coughing and Deep Breathing and Surgical Site Infection Prevention    

## 2012-10-25 NOTE — Pre-Procedure Instructions (Signed)
Blake Miller  10/25/2012   Your procedure is scheduled on: Oct. 16 @0930   Report to [facility]  2 * 3 at [phone].  Call this number if you have problems the morning of surgery: [phone]   Remember:   Do not eat food or drink liquids after midnight.   Take these medicines the morning of surgery with A SIP OF WATER: Norco (Hydrocodone) if needed and Omeprazole (Prilosec) Stop taking Aspirin, Aleve, Ibuprofen, BC's, Goody's, Fish Oil, and Herbal Medications.   Do not wear jewelry, make-up or nail polish.  Do not wear lotions, powders, or perfumes. You may wear deodorant.  Do not shave 48 hours prior to surgery. Men may shave face and neck.  Do not bring valuables to the hospital.  [facility] is not responsible                  for any belongings or valuables.               Contacts, dentures or bridgework may not be worn into surgery.  Leave suitcase in the car. After surgery it may be brought to your room.  For patients admitted to the hospital, discharge time is determined by your                treatment team.               Patients discharged the day of surgery will not be allowed to drive  home.    Special Instructions: Shower using CHG 2 nights before surgery and the night before surgery.  If you shower the day of surgery use CHG.  Use special wash - you have one bottle of CHG for all showers.  You should use approximately 1/3 of the bottle for each shower.   Please read over the following fact sheets that you were given: Pain Booklet, Coughing and Deep Breathing, MRSA Information and Surgical Site Infection Prevention

## 2012-10-25 NOTE — Pre-Procedure Instructions (Signed)
Blake Miller  10/25/2012   Your procedure is scheduled on:  Oct. 16@0930   Report to [facility]  2 * 3 at 0630 AM.  Call this number if you have problems the morning of surgery: [phone]   Remember:   Do not eat food or drink liquids after midnight.   Take these medicines the morning of surgery with A SIP OF WATER:( Norco) Hydrocodone if neededand Omeprazole (Prilosec).   Stop taking Advil, Ibuprofen, Aleve, BC's, Goody's, Fish oil, Aspirin, Or Herbal medications.   Do not wear jewelry, make-up or nail polish.  Do not wear lotions, powders, or perfumes. You may wear deodorant.  Do not shave 48 hours prior to surgery. Men may shave face and neck.  Do not bring valuables to the hospital.  [facility] is not responsible                  for any belongings or valuables.               Contacts, dentures or bridgework may not be worn into surgery.  Leave suitcase in the car. After surgery it may be brought to your room.  For patients admitted to the hospital, discharge time is determined by your                treatment team.               Patients discharged the day of surgery will not be allowed to drive  home.    Special Instructions: Shower using CHG 2 nights before surgery and the night before surgery.  If you shower the day of surgery use CHG.  Use special wash - you have one bottle of CHG for all showers.  You should use approximately 1/3 of the bottle for each shower.   Please read over the following fact sheets that you were given: Pain Booklet, Coughing and Deep Breathing and Surgical Site Infection Prevention

## 2012-10-26 ENCOUNTER — Encounter (HOSPITAL_COMMUNITY)
Admission: RE | Admit: 2012-10-26 | Discharge: 2012-10-26 | Disposition: A | Discharge status: Home / Self Care | Payer: Medicare Other | Source: Ambulatory Visit | Attending: Neurosurgery | Admitting: Neurosurgery

## 2012-10-26 ENCOUNTER — Encounter (HOSPITAL_COMMUNITY)
Admission: RE | Admit: 2012-10-26 | Discharge: 2012-10-26 | Disposition: A | Discharge status: Home / Self Care | Payer: Medicare Other | Source: Ambulatory Visit | Attending: Anesthesiology | Admitting: Anesthesiology

## 2012-10-26 ENCOUNTER — Encounter (HOSPITAL_COMMUNITY): Payer: Self-pay

## 2012-10-26 DIAGNOSIS — Z01812 Encounter for preprocedural laboratory examination: Secondary | ICD-10-CM | POA: Insufficient documentation

## 2012-10-26 DIAGNOSIS — Z01810 Encounter for preprocedural cardiovascular examination: Secondary | ICD-10-CM | POA: Insufficient documentation

## 2012-10-26 DIAGNOSIS — Z01818 Encounter for other preprocedural examination: Secondary | ICD-10-CM | POA: Insufficient documentation

## 2012-10-26 HISTORY — DX: Gastro-esophageal reflux disease without esophagitis: K21.9

## 2012-10-26 HISTORY — DX: Personal history of other diseases of the digestive system: Z87.19

## 2012-10-26 LAB — CBC
HCT: 43.2 % (ref 39.0–52.0)
Hemoglobin: 15.6 g/dL (ref 13.0–17.0)
MCH: 31 pg (ref 26.0–34.0)
MCHC: 36.1 g/dL — ABNORMAL HIGH (ref 30.0–36.0)
RBC: 5.03 MIL/uL (ref 4.22–5.81)

## 2012-10-26 LAB — BASIC METABOLIC PANEL
BUN: 13 mg/dL (ref 6–23)
Chloride: 102 mEq/L (ref 96–112)
GFR calc non Af Amer: 89 mL/min — ABNORMAL LOW (ref >90)
Glucose, Bld: 173 mg/dL — ABNORMAL HIGH (ref 70–99)
Potassium: 4.1 mEq/L (ref 3.5–5.1)
Sodium: 138 mEq/L (ref 135–145)

## 2012-10-26 NOTE — Progress Notes (Signed)
Anesthesia Chart Review:  Patient is a 62 year old male scheduled for C3-4 ACDF, exploration of fusion and removal of plate on 81/19/14 by Dr. Lovell Sheehan.  History includes former smoker, arthritis, osteoporosis, DM2, HTN, hypercholesterolemia, GERD, hiatal hernia, prior back and cervical spine surgeries. PCP is Dr. Toni Arthurs with [facility].    EKG on 10/26/12 showed NSR.  CXR on 10/26/12 showed: Flattening of the contour of the diaphragm on the lateral image consistent with hyperinflation suggesting element of COPD. No acute superimposed cardiopulmonary or pleural abnormalities are identified. Post anterior cervical fusion. No disruption of the hardware evident.  Preoperative labs noted.    Anticipate that he can proceed as planned.  Velna Ochs [facility]/Anesthesiology Phone [phone] 10/26/2012 5:17 PM

## 2012-10-26 NOTE — Progress Notes (Signed)
Denies seeing a cardiologist.   Denies having a recent EKG or CXR. Denies having an echo, stress test, card cath. PCP is Dr Toni [facility]. Voices understanding of pre-admit instructions.  Teach back complete.

## 2012-10-26 NOTE — Progress Notes (Signed)
10/26/12 0915  OBSTRUCTIVE SLEEP APNEA  Have you ever been diagnosed with sleep apnea through a sleep study? No  Do you snore loudly (loud enough to be heard through closed doors)?  1  Do you often feel tired, fatigued, or sleepy during the daytime? 0  Has anyone observed you stop breathing during your sleep? 0  Do you have, or are you being treated for high blood pressure? 1  BMI more than 35 kg/m2? 0  Age over 60 years old? 1  Neck circumference greater than 40 cm/18 inches? 0  Gender: 1  Obstructive Sleep Apnea Score 4  Score 4 or greater  Results sent to PCP

## 2012-11-01 MED ORDER — VANCOMYCIN HCL IN DEXTROSE 1-5 GM/200ML-% IV SOLN
1000.0000 mg | INTRAVENOUS | Status: AC
  Administered 2012-11-02: 1000 mg via INTRAVENOUS

## 2012-11-02 ENCOUNTER — Inpatient Hospital Stay (HOSPITAL_COMMUNITY): Payer: Medicare Other

## 2012-11-02 ENCOUNTER — Encounter (HOSPITAL_COMMUNITY): Payer: Self-pay | Admitting: *Deleted

## 2012-11-02 ENCOUNTER — Encounter (HOSPITAL_COMMUNITY)
Admission: RE | Disposition: A | Discharge status: Home / Self Care | Payer: Self-pay | Source: Ambulatory Visit | Attending: Neurosurgery

## 2012-11-02 ENCOUNTER — Inpatient Hospital Stay (HOSPITAL_COMMUNITY)
Admission: RE | Admit: 2012-11-02 | Discharge: 2012-11-03 | DRG: 472 | Disposition: A | Discharge status: Home / Self Care | Payer: Medicare Other | Source: Ambulatory Visit | Attending: Neurosurgery | Admitting: Neurosurgery

## 2012-11-02 ENCOUNTER — Encounter (HOSPITAL_COMMUNITY): Payer: Medicare Other | Admitting: Vascular Surgery

## 2012-11-02 ENCOUNTER — Inpatient Hospital Stay (HOSPITAL_COMMUNITY): Payer: Medicare Other | Admitting: Certified Registered"

## 2012-11-02 DIAGNOSIS — E7800 Pure hypercholesterolemia, unspecified: Secondary | ICD-10-CM | POA: Diagnosis present

## 2012-11-02 DIAGNOSIS — M4712 Other spondylosis with myelopathy, cervical region: Principal | ICD-10-CM | POA: Diagnosis present

## 2012-11-02 DIAGNOSIS — M5000 Cervical disc disorder with myelopathy, unspecified cervical region: Secondary | ICD-10-CM

## 2012-11-02 DIAGNOSIS — M810 Age-related osteoporosis without current pathological fracture: Secondary | ICD-10-CM | POA: Diagnosis present

## 2012-11-02 DIAGNOSIS — R1310 Dysphagia, unspecified: Secondary | ICD-10-CM | POA: Diagnosis not present

## 2012-11-02 DIAGNOSIS — E119 Type 2 diabetes mellitus without complications: Secondary | ICD-10-CM | POA: Diagnosis present

## 2012-11-02 DIAGNOSIS — Z881 Allergy status to other antibiotic agents status: Secondary | ICD-10-CM

## 2012-11-02 DIAGNOSIS — Z79899 Other long term (current) drug therapy: Secondary | ICD-10-CM

## 2012-11-02 DIAGNOSIS — Z880 Allergy status to penicillin: Secondary | ICD-10-CM

## 2012-11-02 DIAGNOSIS — I10 Essential (primary) hypertension: Secondary | ICD-10-CM | POA: Diagnosis present

## 2012-11-02 DIAGNOSIS — Z888 Allergy status to other drugs, medicaments and biological substances status: Secondary | ICD-10-CM

## 2012-11-02 DIAGNOSIS — K219 Gastro-esophageal reflux disease without esophagitis: Secondary | ICD-10-CM | POA: Diagnosis present

## 2012-11-02 DIAGNOSIS — Z23 Encounter for immunization: Secondary | ICD-10-CM

## 2012-11-02 DIAGNOSIS — Z87891 Personal history of nicotine dependence: Secondary | ICD-10-CM

## 2012-11-02 HISTORY — PX: ANTERIOR CERVICAL DECOMP/DISCECTOMY FUSION: SHX1161

## 2012-11-02 LAB — GLUCOSE, CAPILLARY
Glucose-Capillary: 134 mg/dL — ABNORMAL HIGH (ref 70–99)
Glucose-Capillary: 132 mg/dL — ABNORMAL HIGH (ref 70–99)
Glucose-Capillary: 130 mg/dL — ABNORMAL HIGH (ref 70–99)

## 2012-11-02 SURGERY — ANTERIOR CERVICAL DECOMPRESSION/DISCECTOMY FUSION 1 LEVEL/HARDWARE REMOVAL
Anesthesia: General | Wound class: Clean

## 2012-11-02 MED ORDER — HYDROMORPHONE HCL PF 1 MG/ML IJ SOLN: 1.0000 mg | INTRAMUSCULAR | Status: DC | PRN

## 2012-11-02 MED ORDER — SODIUM CHLORIDE 0.9 % IR SOLN
Status: DC | PRN
  Administered 2012-11-02: 12:00:00

## 2012-11-02 MED ORDER — LACTATED RINGERS IV SOLN
INTRAVENOUS | Status: DC
  Administered 2012-11-02: 18:00:00 via INTRAVENOUS

## 2012-11-02 MED ORDER — OXYCODONE HCL 5 MG PO TABS
5.0000 mg | ORAL_TABLET | Freq: Once | ORAL | Status: AC | PRN
  Administered 2012-11-02: 5 mg via ORAL

## 2012-11-02 MED ORDER — MENTHOL 3 MG MT LOZG: 1.0000 | LOZENGE | OROMUCOSAL | Status: DC | PRN

## 2012-11-02 MED ORDER — 0.9 % SODIUM CHLORIDE (POUR BTL) OPTIME
TOPICAL | Status: DC | PRN
  Administered 2012-11-02: 1000 mL

## 2012-11-02 MED ORDER — VANCOMYCIN HCL IN DEXTROSE 1-5 GM/200ML-% IV SOLN
1000.0000 mg | Freq: Once | INTRAVENOUS | Status: AC
  Administered 2012-11-02: 1000 mg via INTRAVENOUS
  Filled 2012-11-02 (×2): qty 200

## 2012-11-02 MED ORDER — MIDAZOLAM HCL 5 MG/5ML IJ SOLN
INTRAMUSCULAR | Status: DC | PRN
  Administered 2012-11-02: 2 mg via INTRAVENOUS

## 2012-11-02 MED ORDER — LIDOCAINE HCL (CARDIAC) 20 MG/ML IV SOLN
INTRAVENOUS | Status: DC | PRN
  Administered 2012-11-02: 100 mg via INTRAVENOUS

## 2012-11-02 MED ORDER — ROCURONIUM BROMIDE 100 MG/10ML IV SOLN
INTRAVENOUS | Status: DC | PRN
  Administered 2012-11-02: 50 mg via INTRAVENOUS

## 2012-11-02 MED ORDER — HYDROMORPHONE HCL PF 1 MG/ML IJ SOLN
INTRAMUSCULAR | Status: AC
  Filled 2012-11-02: qty 1

## 2012-11-02 MED ORDER — LACTATED RINGERS IV SOLN
INTRAVENOUS | Status: DC
  Administered 2012-11-02: 50 mL/h via INTRAVENOUS
  Administered 2012-11-02: 13:00:00 via INTRAVENOUS

## 2012-11-02 MED ORDER — MEPERIDINE HCL 25 MG/ML IJ SOLN: 6.2500 mg | INTRAMUSCULAR | Status: DC | PRN

## 2012-11-02 MED ORDER — HEMOSTATIC AGENTS (NO CHARGE) OPTIME
TOPICAL | Status: DC | PRN
  Administered 2012-11-02: 1 via TOPICAL

## 2012-11-02 MED ORDER — ALUM & MAG HYDROXIDE-SIMETH 200-200-20 MG/5ML PO SUSP: 30.0000 mL | Freq: Four times a day (QID) | ORAL | Status: DC | PRN

## 2012-11-02 MED ORDER — OXYCODONE HCL 5 MG/5ML PO SOLN: 5.0000 mg | Freq: Once | ORAL | Status: AC | PRN

## 2012-11-02 MED ORDER — PROPOFOL 10 MG/ML IV BOLUS
INTRAVENOUS | Status: DC | PRN
  Administered 2012-11-02: 130 mg via INTRAVENOUS

## 2012-11-02 MED ORDER — GLYCOPYRROLATE 0.2 MG/ML IJ SOLN
INTRAMUSCULAR | Status: DC | PRN
  Administered 2012-11-02: .5 mg via INTRAVENOUS

## 2012-11-02 MED ORDER — ONDANSETRON HCL 4 MG/2ML IJ SOLN
INTRAMUSCULAR | Status: DC | PRN
  Administered 2012-11-02: 4 mg via INTRAMUSCULAR

## 2012-11-02 MED ORDER — INFLUENZA VAC SPLIT QUAD 0.5 ML IM SUSP
0.5000 mL | INTRAMUSCULAR | Status: DC
  Filled 2012-11-02: qty 0.5

## 2012-11-02 MED ORDER — NEOSTIGMINE METHYLSULFATE 1 MG/ML IJ SOLN
INTRAMUSCULAR | Status: DC | PRN
  Administered 2012-11-02: 3 mg via INTRAVENOUS

## 2012-11-02 MED ORDER — ONDANSETRON HCL 4 MG/2ML IJ SOLN: 4.0000 mg | INTRAMUSCULAR | Status: DC | PRN

## 2012-11-02 MED ORDER — THROMBIN 5000 UNITS EX SOLR
CUTANEOUS | Status: DC | PRN
  Administered 2012-11-02 (×2): 5000 [IU] via TOPICAL

## 2012-11-02 MED ORDER — HYDROCODONE-ACETAMINOPHEN 5-325 MG PO TABS
1.0000 | ORAL_TABLET | ORAL | Status: DC | PRN
  Administered 2012-11-02 (×2): 2 via ORAL
  Filled 2012-11-02 (×2): qty 2

## 2012-11-02 MED ORDER — DOCUSATE SODIUM 100 MG PO CAPS
100.0000 mg | ORAL_CAPSULE | Freq: Two times a day (BID) | ORAL | Status: DC
  Administered 2012-11-02: 100 mg via ORAL
  Filled 2012-11-02: qty 1

## 2012-11-02 MED ORDER — DIAZEPAM 5 MG PO TABS
ORAL_TABLET | ORAL | Status: AC
  Filled 2012-11-02: qty 1

## 2012-11-02 MED ORDER — DIAZEPAM 5 MG PO TABS
5.0000 mg | ORAL_TABLET | Freq: Four times a day (QID) | ORAL | Status: DC | PRN
  Administered 2012-11-02 (×2): 5 mg via ORAL
  Filled 2012-11-02: qty 1

## 2012-11-02 MED ORDER — HYDROMORPHONE HCL 2 MG PO TABS
4.0000 mg | ORAL_TABLET | ORAL | Status: DC | PRN
  Administered 2012-11-02 – 2012-11-03 (×3): 4 mg via ORAL
  Filled 2012-11-02 (×3): qty 2

## 2012-11-02 MED ORDER — BUPIVACAINE-EPINEPHRINE 0.5% -1:200000 IJ SOLN
INTRAMUSCULAR | Status: DC | PRN
  Administered 2012-11-02: 10 mL

## 2012-11-02 MED ORDER — OXYCODONE HCL 5 MG PO TABS
ORAL_TABLET | ORAL | Status: AC
  Filled 2012-11-02: qty 1

## 2012-11-02 MED ORDER — SUFENTANIL CITRATE 50 MCG/ML IV SOLN
INTRAVENOUS | Status: DC | PRN
  Administered 2012-11-02: 20 ug via INTRAVENOUS
  Administered 2012-11-02 (×2): 10 ug via INTRAVENOUS

## 2012-11-02 MED ORDER — PANTOPRAZOLE SODIUM 40 MG PO TBEC
40.0000 mg | DELAYED_RELEASE_TABLET | Freq: Every day | ORAL | Status: DC
  Administered 2012-11-02: 40 mg via ORAL
  Filled 2012-11-02: qty 1

## 2012-11-02 MED ORDER — ONDANSETRON HCL 4 MG/2ML IJ SOLN: 4.0000 mg | Freq: Once | INTRAMUSCULAR | Status: DC | PRN

## 2012-11-02 MED ORDER — HYDROMORPHONE HCL PF 1 MG/ML IJ SOLN
0.2500 mg | INTRAMUSCULAR | Status: DC | PRN
  Administered 2012-11-02 (×2): 0.5 mg via INTRAVENOUS

## 2012-11-02 MED ORDER — BACITRACIN ZINC 500 UNIT/GM EX OINT
TOPICAL_OINTMENT | CUTANEOUS | Status: DC | PRN
  Administered 2012-11-02: 1 via TOPICAL

## 2012-11-02 MED ORDER — PHENOL 1.4 % MT LIQD: 1.0000 | OROMUCOSAL | Status: DC | PRN

## 2012-11-02 SURGICAL SUPPLY — 66 items
APL SKNCLS STERI-STRIP NONHPOA (GAUZE/BANDAGES/DRESSINGS) ×1
BAG DECANTER FOR FLEXI CONT (MISCELLANEOUS) ×2 IMPLANT
BENZOIN TINCTURE PRP APPL 2/3 (GAUZE/BANDAGES/DRESSINGS) ×3 IMPLANT
BIT DRILL NEURO 2X3.1 SFT TUCH (MISCELLANEOUS) ×1 IMPLANT
BLADE SURG 15 STRL LF DISP TIS (BLADE) ×1 IMPLANT
BLADE SURG 15 STRL SS (BLADE) ×2
BLADE ULTRA TIP 2M (BLADE) ×2 IMPLANT
BRUSH SCRUB EZ PLAIN DRY (MISCELLANEOUS) ×2 IMPLANT
BUR BARREL STRAIGHT FLUTE 4.0 (BURR) ×2 IMPLANT
BUR MATCHSTICK NEURO 3.0 LAGG (BURR) ×2 IMPLANT
CANISTER SUCTION 2500CC (MISCELLANEOUS) ×2 IMPLANT
CONT SPEC 4OZ CLIKSEAL STRL BL (MISCELLANEOUS) ×2 IMPLANT
COVER MAYO STAND STRL (DRAPES) ×2 IMPLANT
DRAPE LAPAROTOMY 100X72 PEDS (DRAPES) ×2 IMPLANT
DRAPE MICROSCOPE LEICA (MISCELLANEOUS) IMPLANT
DRAPE POUCH INSTRU U-SHP 10X18 (DRAPES) ×2 IMPLANT
DRAPE SURG 17X23 STRL (DRAPES) ×4 IMPLANT
DRILL NEURO 2X3.1 SOFT TOUCH (MISCELLANEOUS) ×2
ELECT REM PT RETURN 9FT ADLT (ELECTROSURGICAL) ×2
ELECTRODE REM PT RTRN 9FT ADLT (ELECTROSURGICAL) ×1 IMPLANT
GAUZE SPONGE 4X4 16PLY XRAY LF (GAUZE/BANDAGES/DRESSINGS) IMPLANT
GLOVE BIO SURGEON STRL SZ8 (GLOVE) ×1 IMPLANT
GLOVE BIO SURGEON STRL SZ8.5 (GLOVE) ×2 IMPLANT
GLOVE BIOGEL PI IND STRL 7.0 (GLOVE) IMPLANT
GLOVE BIOGEL PI IND STRL 8.5 (GLOVE) IMPLANT
GLOVE BIOGEL PI INDICATOR 7.0 (GLOVE) ×2
GLOVE BIOGEL PI INDICATOR 8.5 (GLOVE) ×1
GLOVE EXAM NITRILE LRG STRL (GLOVE) IMPLANT
GLOVE EXAM NITRILE MD LF STRL (GLOVE) IMPLANT
GLOVE EXAM NITRILE XL STR (GLOVE) IMPLANT
GLOVE EXAM NITRILE XS STR PU (GLOVE) IMPLANT
GLOVE SS BIOGEL STRL SZ 6.5 (GLOVE) IMPLANT
GLOVE SS BIOGEL STRL SZ 8 (GLOVE) ×1 IMPLANT
GLOVE SUPERSENSE BIOGEL SZ 6.5 (GLOVE) ×2
GLOVE SUPERSENSE BIOGEL SZ 8 (GLOVE) ×1
GLOVE SURG SS PI 6.5 STRL IVOR (GLOVE) ×1 IMPLANT
GOWN BRE IMP SLV AUR LG STRL (GOWN DISPOSABLE) ×3 IMPLANT
GOWN BRE IMP SLV AUR XL STRL (GOWN DISPOSABLE) ×2 IMPLANT
KIT BASIN OR (CUSTOM PROCEDURE TRAY) ×2 IMPLANT
KIT ROOM TURNOVER OR (KITS) ×2 IMPLANT
MARKER SKIN DUAL TIP RULER LAB (MISCELLANEOUS) ×2 IMPLANT
NDL SPNL 18GX3.5 QUINCKE PK (NEEDLE) ×1 IMPLANT
NEEDLE HYPO 22GX1.5 SAFETY (NEEDLE) ×2 IMPLANT
NEEDLE SPNL 18GX3.5 QUINCKE PK (NEEDLE) ×2 IMPLANT
NS IRRIG 1000ML POUR BTL (IV SOLUTION) ×2 IMPLANT
PACK LAMINECTOMY NEURO (CUSTOM PROCEDURE TRAY) ×2 IMPLANT
PEEK ACIS PARALLEL 7MM (Peek) ×1 IMPLANT
PIN DISTRACTION 14MM (PIN) ×4 IMPLANT
PLATE SKYLINE 12MM (Plate) ×1 IMPLANT
PUTTY ABX ACTIFUSE 1.5ML (Putty) ×2 IMPLANT
RASP 3.0MM (RASP) ×1 IMPLANT
RUBBERBAND STERILE (MISCELLANEOUS) IMPLANT
SCREW VAR SELF TAP SKYLINE 14M (Screw) ×4 IMPLANT
SPACER ACIS PARALLEL 8MM SPINE (Spacer) ×1 IMPLANT
SPONGE GAUZE 4X4 12PLY (GAUZE/BANDAGES/DRESSINGS) ×2 IMPLANT
SPONGE INTESTINAL PEANUT (DISPOSABLE) ×3 IMPLANT
SPONGE SURGIFOAM ABS GEL SZ50 (HEMOSTASIS) ×2 IMPLANT
STRIP CLOSURE SKIN 1/2X4 (GAUZE/BANDAGES/DRESSINGS) ×2 IMPLANT
SUT VIC AB 0 CT1 27 (SUTURE) ×2
SUT VIC AB 0 CT1 27XBRD ANTBC (SUTURE) ×1 IMPLANT
SUT VIC AB 3-0 SH 8-18 (SUTURE) ×2 IMPLANT
SYR 20ML ECCENTRIC (SYRINGE) ×2 IMPLANT
TAPE CLOTH SURG 4X10 WHT LF (GAUZE/BANDAGES/DRESSINGS) ×1 IMPLANT
TOWEL OR 17X24 6PK STRL BLUE (TOWEL DISPOSABLE) ×2 IMPLANT
TOWEL OR 17X26 10 PK STRL BLUE (TOWEL DISPOSABLE) ×2 IMPLANT
WATER STERILE IRR 1000ML POUR (IV SOLUTION) ×2 IMPLANT

## 2012-11-02 NOTE — Preoperative (Signed)
Beta Blockers   Reason not to administer Beta Blockers:Not Applicable 

## 2012-11-02 NOTE — Anesthesia Procedure Notes (Signed)
Procedure Name: Intubation Date/Time: 11/02/2012 11:39 AM Performed by: Charm Barges, Jolyssa Oplinger R Pre-anesthesia Checklist: Patient identified, Emergency Drugs available, Suction available, Patient being monitored and Timeout performed Patient Re-evaluated:Patient Re-evaluated prior to inductionOxygen Delivery Method: Circle system utilized Preoxygenation: Pre-oxygenation with 100% oxygen Intubation Type: IV induction Ventilation: Mask ventilation without difficulty Laryngoscope size: McGrath X blade. Grade View: Grade II Tube type: Oral Tube size: 8.0 mm Number of attempts: 1 Airway Equipment and Method: Stylet Placement Confirmation: ETT inserted through vocal cords under direct vision,  positive ETCO2 and breath sounds checked- equal and bilateral Secured at: 22 cm Tube secured with: Tape Dental Injury: Teeth and Oropharynx as per pre-operative assessment

## 2012-11-02 NOTE — Progress Notes (Signed)
Patient ID: Blake Miller, male   DOB: Nov 23, 1950, 62 y.o.   MRN: [id number] Subjective:  The patient is alert and pleasant. He is in no apparent distress.  Objective: Vital signs in last 24 hours: Temp:  [97.1 F (36.2 C)] 97.1 F (36.2 C) (10/16 0646) Pulse Rate:  [75] 75 (10/16 0646) Resp:  [20] 20 (10/16 0646) BP: (144-164)/(83-103) 144/83 mmHg (10/16 0726) SpO2:  [99 %] 99 % (10/16 0646)  Intake/Output from previous day:   Intake/Output this shift: Total I/O In: 1150 [I.V.:1150] Out: 200 [Blood:200]  Physical exam the patient is alert and pleasant. He is moving all 4 extremities well. His dressing is clean and dry. There is no evidence of hematoma or shift.  Lab Results: No results found for this basename: WBC, HGB, HCT, PLT,  in the last 72 hours BMET No results found for this basename: NA, K, CL, CO2, GLUCOSE, BUN, CREATININE, CALCIUM,  in the last 72 hours  Studies/Results: Dg Cervical Spine 1 View  11/02/2012   CLINICAL DATA:  Spinal cord compression at C3-4. Cervical spondylosis.  EXAM: DG CERVICAL SPINE - 1 VIEW  COMPARISON:  MRI dated 09/09/2012  FINDINGS: Lateral radiograph demonstrates the patient has undergone anterior cervical fusion at C3-4. Anterior plate and screws and plug appear in good position with anatomic alignment of the vertebra at the operative level.  IMPRESSION: Anterior cervical fusion performed at C3-4.   Electronically Signed   By: Geanie Cooley M.D.   On: 11/02/2012 13:56    Assessment/Plan: Patient is doing well.  LOS: 0 days     Denetta Fei D 11/02/2012, 2:11 PM

## 2012-11-02 NOTE — Transfer of Care (Signed)
Immediate Anesthesia Transfer of Care Note  Patient: DAVANTA MEUSER  Procedure(s) Performed: Procedure(s) with comments: ANTERIOR CERVICAL DECOMPRESSION/DISCECTOMY FUSION 1 LEVEL/HARDWARE REMOVAL (N/A) - C34 anterior cervical decompression with fusion interbody prosthesis plating and bonegraft with exploration of fusion and removal of old plate  Patient Location: PACU  Anesthesia Type:General  Level of Consciousness: awake, alert  and oriented  Airway & Oxygen Therapy: Patient Spontanous Breathing and Patient connected to nasal cannula oxygen  Post-op Assessment: Report given to PACU RN, Post -op Vital signs reviewed and stable and Patient moving all extremities  Post vital signs: Reviewed and stable  Complications: No apparent anesthesia complications

## 2012-11-02 NOTE — Anesthesia Preprocedure Evaluation (Signed)
Anesthesia Evaluation  Patient identified by MRN, date of birth, ID band Patient awake    Reviewed: Allergy & Precautions, H&P , NPO status , Patient's Chart, lab work & pertinent test results  Airway Mallampati: I TM Distance: >3 FB Neck ROM: Full    Dental   Pulmonary          Cardiovascular hypertension, Pt. on medications     Neuro/Psych    GI/Hepatic GERD-  Medicated and Controlled,  Endo/Other    Renal/GU      Musculoskeletal   Abdominal   Peds  Hematology   Anesthesia Other Findings   Reproductive/Obstetrics                           Anesthesia Physical Anesthesia Plan  ASA: II  Anesthesia Plan: General   Post-op Pain Management:    Induction: Intravenous  Airway Management Planned: Oral ETT  Additional Equipment:   Intra-op Plan:   Post-operative Plan: Extubation in OR  Informed Consent: I have reviewed the patients History and Physical, chart, labs and discussed the procedure including the risks, benefits and alternatives for the proposed anesthesia with the patient or authorized representative who has indicated his/her understanding and acceptance.     Plan Discussed with: CRNA and Surgeon  Anesthesia Plan Comments:         Anesthesia Quick Evaluation  

## 2012-11-02 NOTE — Anesthesia Postprocedure Evaluation (Signed)
Anesthesia Post Note  Patient: Blake Miller  Procedure(s) Performed: Procedure(s) (LRB): ANTERIOR CERVICAL DECOMPRESSION/DISCECTOMY FUSION 1 LEVEL/HARDWARE REMOVAL (N/A)  Anesthesia type: general  Patient location: PACU  Post pain: Pain level controlled  Post assessment: Patient's Cardiovascular Status Stable  Last Vitals:  Filed Vitals:   11/02/12 1530  BP: 159/77  Pulse: 68  Temp: 36.9 C  Resp: 18    Post vital signs: Reviewed and stable  Level of consciousness: sedated  Complications: No apparent anesthesia complications

## 2012-11-02 NOTE — H&P (Signed)
Subjective: The patient is a 62 year old white male who I performed a C4-5, C5-6 and C6-7 anterior cervical discectomy, fusion, and plating on many years ago. He has developed recurrent neck and shoulder pain consistent with a cervical radiculopathy. He has failed medical management and was worked up with a cervical MRI. This demonstrated a herniated disc at C3-4. I discussed the various treatment option with the patient including surgery. The patient has weighed the risks, benefits, and alternatives surgery and decided proceed with a C3-4 anterior cervicectomy, fusion, plating with an exploration of his old fusion and likely removal of his old plate.  Past Medical History  Diagnosis Date  . Arthritis   . Osteoporosis   . High cholesterol   . Hypertension   . H/O hiatal hernia   . GERD (gastroesophageal reflux disease)   . Diabetes mellitus without complication     Past Surgical History  Procedure Laterality Date  . Back surgery    . Cervical spine surgery    . Colonoscopy      12 years ago  . Esophagogastroduodenoscopy      10 years ago    Allergies  Allergen Reactions  . Amoxicillin     REACTION: unknown  . Celecoxib     REACTION: headache  . Cephalexin     REACTION: unknown  . Lisinopril     REACTION: felt bad, muscle cramps, stomach pain  . Oxycodone-Acetaminophen     REACTION: generic, headache  . Penicillins     REACTION: unknown  . Risedronate Sodium     REACTION: abd pain and muscle aches  . Statins     Stomach ache, joint pain   . Tramadol Hcl     REACTION: diarrhea  . Vascepa [Epa Ethyl Ester]     Cramping. Stomach aches.      History  Substance Use Topics  . Smoking status: Former Smoker    Quit date: 02/21/2002  . Smokeless tobacco: Never Used  . Alcohol Use: No     Comment: None since 1997    No family history on file. Prior to Admission medications   Medication Sig Start Date End Date Taking? Authorizing Provider  Calcium Carbonate-Vitamin D  (CALTRATE 600+D) 600-400 MG-UNIT per tablet Take 1 tablet by mouth daily.   Yes Historical Provider, MD  cholecalciferol (VITAMIN D) 1000 UNITS tablet Take 1,000 Units by mouth daily.   Yes Historical Provider, MD  cyclobenzaprine (FLEXERIL) 10 MG tablet Take 10 mg by mouth 3 (three) times daily as needed for muscle spasms.   Yes Historical Provider, MD  fish oil-omega-3 fatty acids 1000 MG capsule Take 1 g by mouth daily.   Yes Historical Provider, MD  HYDROcodone-acetaminophen (NORCO) 10-325 MG per tablet Take 1 tablet by mouth every 6 (six) hours as needed. pain   Yes Historical Provider, MD  omeprazole (PRILOSEC) 20 MG capsule Take 20 mg by mouth 2 (two) times daily.   Yes Historical Provider, MD     Review of Systems  Positive ROS: As above  All other systems have been reviewed and were otherwise negative with the exception of those mentioned in the HPI and as above.  Objective: Vital signs in last 24 hours: Temp:  [97.1 F (36.2 C)] 97.1 F (36.2 C) (10/16 0646) Pulse Rate:  [75] 75 (10/16 0646) Resp:  [20] 20 (10/16 0646) BP: (144-164)/(83-103) 144/83 mmHg (10/16 0726) SpO2:  [99 %] 99 % (10/16 0646)  General Appearance: Alert, cooperative, no distress, appears stated age Head:  Normocephalic, without obvious abnormality, atraumatic Eyes: PERRL, conjunctiva/corneas clear, EOM's intact, fundi benign, both eyes      Ears: Normal TM's and external ear canals, both ears Throat: Lips, mucosa, and tongue normal; teeth and gums normal Neck: Supple, symmetrical, trachea midline, no adenopathy; thyroid: No enlargement/tenderness/nodules; no carotid bruit or JVD. The patient's left anterior cervical incision is well-healed. Back: Symmetric, no curvature, ROM normal, no CVA tenderness Lungs: Clear to auscultation bilaterally, respirations unlabored Heart: Regular rate and rhythm, S1 and S2 normal, no murmur, rub or gallop Abdomen: Soft, non-tender, bowel sounds active all four quadrants,  no masses, no organomegaly Extremities: Extremities normal, atraumatic, no cyanosis or edema Pulses: 2+ and symmetric all extremities Skin: Skin color, texture, turgor normal, no rashes or lesions  NEUROLOGIC:   Mental status: alert and oriented, no aphasia, good attention span, Fund of knowledge/ memory ok Motor Exam - grossly normal Sensory Exam - grossly normal Reflexes:  Coordination - grossly normal Gait - grossly normal Balance - grossly normal Cranial Nerves: I: smell Not tested  II: visual acuity  OS: Normal    OD: Normal   II: visual fields Full to confrontation  II: pupils Equal, round, reactive to light  III,VII: ptosis None  III,IV,VI: extraocular muscles  Full ROM  V: mastication Normal  V: facial light touch sensation  Normal  V,VII: corneal reflex  Present  VII: facial muscle function - upper  Normal  VII: facial muscle function - lower Normal  VIII: hearing Not tested  IX: soft palate elevation  Normal  IX,X: gag reflex Present  XI: trapezius strength  5/5  XI: sternocleidomastoid strength 5/5  XI: neck flexion strength  5/5  XII: tongue strength  Normal    Data Review Lab Results  Component Value Date   WBC 8.8 10/26/2012   HGB 15.6 10/26/2012   HCT 43.2 10/26/2012   MCV 85.9 10/26/2012   PLT 174 10/26/2012   Lab Results  Component Value Date   NA 138 10/26/2012   K 4.1 10/26/2012   CL 102 10/26/2012   CO2 26 10/26/2012   BUN 13 10/26/2012   CREATININE 0.95 10/26/2012   GLUCOSE 173* 10/26/2012   No results found for this basename: INR, PROTIME    Assessment/Plan: C3-4 herniated disc, cervicalgia, cervical rib This, cervical myelopathy: I discussed situation with the patient. I reviewed his MR scan with them and pointed out the abnormalities. We have discussed the various treatment options including surgery. I have described the surgical treatment option of an exploration of cervical fusion with likely removal results cervical plate and a W0-9 anterior  cervicectomy, fusion, and plating. I've shown the patient's surgical models. We have discussed the risks, benefits, alternatives, and likelihood of achieving our goals with surgery. I have answered all the patient's questions. He wants to proceed with surgery.   Kylei Purington D 11/02/2012 10:46 AM

## 2012-11-02 NOTE — Op Note (Signed)
Brief history: The patient is a 62 year old white male who I performed a C4-5, C5-6 and C6-7 anterior cervicectomy, fusion, and plating on years ago. He has done well but has developed recurrent neck and shoulder pain consistent with a cervical radiculopathy. He has failed medical management and was worked up with a cervical MRI. This demonstrated this degeneration, stenosis, spondylosis, herniated disc, etc. at C3-4. I discussed the various treatment option with the patient including surgery. The patient has weighed the risks, benefits, and alternatives surgery and decided proceed with a C3-4 anterior cervical discectomy, fusion, and plating.  Preoperative diagnosis: C3-4 disc degeneration, spondylosis, herniated disc, stenosis, cervicalgia, cervical discopathy, cervical myelopathy  Postoperative diagnosis: The same  Procedure: C3-4 Anterior cervical discectomy/decompression; C3-4 interbody arthrodesis with local morcellized autograft bone and Actifuse bone graft extender; insertion of interbody prosthesis at C3-4 ( Depuy peek interbody prosthesis); anterior cervical plating from C3-4 with Depuy titanium plate  Surgeon: Dr. Delma Officer  Asst.: Dr. Maeola Harman  Anesthesia: Gen. endotracheal  Estimated blood loss: 75 cc  Drains: None  Complications: None  Description of procedure: The patient was brought to the operating room by the anesthesia team. General endotracheal anesthesia was induced. A roll was placed under the patient's shoulders to keep the neck in the neutral position. The patient's anterior cervical region was then prepared with Betadine scrub and Betadine solution. Sterile drapes were applied.  The area to be incised was then injected with Marcaine with epinephrine solution. I then used a scalpel to make a transverse incision in the patient's left anterior neck. I used the Metzenbaum scissors to divide the platysmal muscle and then to dissect through the scar tissue and to  dissect medial to the sternocleidomastoid muscle, jugular vein, and carotid artery. I carefully dissected down towards the anterior cervical spine identifying the esophagus and retracting it medially. Then using Kitner swabs to clear soft tissue from the anterior cervical spine and to expose the upper aspect of the old plate.   I then used electrocautery to detach the medial border of the longus colli muscle bilaterally from the C3-4 intervertebral disc spaces. I then inserted the Caspar self-retaining retractor underneath the longus colli muscle bilaterally to provide exposure.  We then incised the intervertebral disc at C3-4, it was quite spondylotic. We then performed a partial intervertebral discectomy with a pituitary forceps and the Karlin curettes. I removed the old screws at C4 by unlocking the cams and removed the screws. I then inserted distraction screws into the vertebral bodies at C3 and one of the old screw holes at C4. We then distracted the interspace. We then used the high-speed drill to decorticate the vertebral endplates at C3-4, to drill away the remainder of the intervertebral disc, to drill away some posterior spondylosis, and to thin out the posterior longitudinal ligament. I then incised ligament with the arachnoid knife. We then removed the ligament with a Kerrison punches undercutting the vertebral endplates and decompressing the thecal sac. We then performed foraminotomies about the bilateral C4 nerve roots. This completed the decompression at this level.  We now turned our to attention to the interbody fusion. We used the trial spacers to determine the appropriate size for the interbody prosthesis. We then pre-filled prosthesis with a combination of local morcellized autograft bone that we obtained during decompression as well as Actifuse bone graft extender. We then inserted the prosthesis into the distracted interspace at C3-4. We then removed the distraction screws. There was a  good snug fit of  the prosthesis in the interspace.  Having completed the fusion we now turned attention to the anterior spinal instrumentation. We used the high-speed drill to drill away some anterior spondylosis at the disc spaces so that the plate lay down flat. We attempted to place a single level plate adjacent to the old multilevel plate. There was not quite enough room. I felt to be less traumatic to drill off a couple of millimeters off the upper aspect of the old plate then to remove the old plate. We selected the appropriate length titanium anterior cervical plate. We laid it along the anterior aspect of the vertebral bodies from C3-4. We then drilled 14 mm holes at C3 and C4. We then secured the plate to the vertebral bodies by placing two 14 mm self-tapping screws at C3 and C4. We then obtained intraoperative radiograph. The demonstrating good position of the instrumentation. We therefore secured the screws the plate the locking each cam. This completed the instrumentation.  We then obtained hemostasis using bipolar electrocautery. We irrigated the wound out with bacitracin solution. We then removed the retractor. We inspected the esophagus for any damage. There was none apparent. We then reapproximated patient's platysmal muscle with interrupted 3-0 Vicryl suture. We then reapproximated the subcutaneous tissue with interrupted 3-0 Vicryl suture. The skin was reapproximated with Steri-Strips and benzoin. The wound was then covered with bacitracin ointment. A sterile dressing was applied. The drapes were removed. Patient was subsequently extubated by the anesthesia team and transported to the post anesthesia care unit in stable condition. All sponge instrument and needle counts were reportedly correct at the end of this case.

## 2012-11-03 LAB — GLUCOSE, CAPILLARY: Glucose-Capillary: 169 mg/dL — ABNORMAL HIGH (ref 70–99)

## 2012-11-03 MED ORDER — DIAZEPAM 5 MG PO TABS: 5.0000 mg | ORAL_TABLET | Freq: Four times a day (QID) | ORAL | Status: DC | PRN

## 2012-11-03 MED ORDER — DSS 100 MG PO CAPS: 100.0000 mg | ORAL_CAPSULE | Freq: Two times a day (BID) | ORAL | Status: DC

## 2012-11-03 MED ORDER — HYDROMORPHONE HCL 4 MG PO TABS: 4.0000 mg | ORAL_TABLET | ORAL | Status: DC | PRN

## 2012-11-03 MED ORDER — DEXAMETHASONE SODIUM PHOSPHATE 4 MG/ML IJ SOLN
4.0000 mg | Freq: Once | INTRAMUSCULAR | Status: AC
  Administered 2012-11-03: 4 mg via INTRAVENOUS
  Filled 2012-11-03: qty 1

## 2012-11-03 NOTE — Progress Notes (Signed)
Pt doing very well. Pt given D/C instructions with Rx's, verbal understanding given. Pt D/C'd home via walking per MD order. Rema Fendt, RN

## 2012-11-03 NOTE — Discharge Summary (Signed)
Physician Discharge Summary  Patient ID: CAISEN MANGAS MRN: [id number] DOB/AGE: [birth year] 62 y.o.  Admit date: 11/02/2012 Discharge date: 11/03/2012  Admission Diagnoses: C3-4 herniated disc, spinal stenosis, cervical myelopathy, cervicalgia, cervical radiculopathy  Discharge Diagnoses: The same Active Problems:   * No active hospital problems. *   Discharged Condition: good  Hospital Course: I performed a C3-4 anterior cervical discectomy, fusion, and plating on the patient on 11/02/2012. The surgery went well.  On postop day #1 the patient requested discharge to home. He has some mild dysphagia. He was given oral and written discharge instructions. All his questions were answered.  Consults: None Significant Diagnostic Studies: None Treatments: C3-4 anterior cervical discectomy, fusion, and plating. Discharge Exam: Blood pressure 140/82, pulse 89, temperature 97.7 F (36.5 C), temperature source Oral, resp. rate 18, SpO2 99.00%. Patient is alert and oriented. His strength is normal. His dressing has a small old bloodstain. There is no evidence of hematoma or shift.  Disposition: Home  Discharge Orders   Future Orders Complete By Expires   Call MD for:  difficulty breathing, headache or visual disturbances  As directed    Call MD for:  extreme fatigue  As directed    Call MD for:  hives  As directed    Call MD for:  persistant dizziness or light-headedness  As directed    Call MD for:  persistant nausea and vomiting  As directed    Call MD for:  redness, tenderness, or signs of infection (pain, swelling, redness, odor or green/yellow discharge around incision site)  As directed    Call MD for:  severe uncontrolled pain  As directed    Call MD for:  temperature >100.4  As directed    Diet - low sodium heart healthy  As directed    Discharge instructions  As directed    Comments:     Call [phone] for a followup appointment. Take a stool softener while you are using  pain medications.   Driving Restrictions  As directed    Comments:     Do not drive for 2 weeks.   Increase activity slowly  As directed    Lifting restrictions  As directed    Comments:     Do not lift more than 5 pounds. No excessive bending or twisting.   May shower / Bathe  As directed    Comments:     He may shower after the pain she is removed 3 days after surgery. Leave the incision alone.   Remove dressing in 48 hours  As directed    Comments:     Your stitches are under the scan and will dissolve by themselves. The Steri-Strips will fall off after you take a few showers. Do not rub back or pick at the wound, Leave the wound alone.       Medication List    STOP taking these medications       cyclobenzaprine 10 MG tablet  Commonly known as:  FLEXERIL     HYDROcodone-acetaminophen 10-325 MG per tablet  Commonly known as:  NORCO      TAKE these medications       CALTRATE 600+D 600-400 MG-UNIT per tablet  Generic drug:  Calcium Carbonate-Vitamin D  Take 1 tablet by mouth daily.     cholecalciferol 1000 UNITS tablet  Commonly known as:  VITAMIN D  Take 1,000 Units by mouth daily.     diazepam 5 MG tablet  Commonly known as:  VALIUM  Take 1 tablet (5 mg total) by mouth every 6 (six) hours as needed.     DSS 100 MG Caps  Take 100 mg by mouth 2 (two) times daily.     fish oil-omega-3 fatty acids 1000 MG capsule  Take 1 g by mouth daily.     HYDROmorphone 4 MG tablet  Commonly known as:  DILAUDID  Take 1 tablet (4 mg total) by mouth every 4 (four) hours as needed.     omeprazole 20 MG capsule  Commonly known as:  PRILOSEC  Take 20 mg by mouth 2 (two) times daily.         SignedCristi Loron 11/03/2012, 7:39 AM

## 2012-11-03 NOTE — Progress Notes (Signed)
Utilization review complete. Jahzeel Poythress RN CCM Case Mgmt  

## 2012-11-06 ENCOUNTER — Encounter (HOSPITAL_COMMUNITY): Payer: Self-pay | Admitting: Neurosurgery

## 2013-03-06 ENCOUNTER — Emergency Department (HOSPITAL_COMMUNITY)
Admission: EM | Admit: 2013-03-06 | Discharge: 2013-03-06 | Disposition: A | Discharge status: Home / Self Care | Payer: Medicare Other | Attending: Emergency Medicine | Admitting: Emergency Medicine

## 2013-03-06 ENCOUNTER — Emergency Department (HOSPITAL_COMMUNITY): Payer: Medicare Other

## 2013-03-06 ENCOUNTER — Encounter (HOSPITAL_COMMUNITY): Payer: Self-pay | Admitting: Emergency Medicine

## 2013-03-06 DIAGNOSIS — N451 Epididymitis: Secondary | ICD-10-CM

## 2013-03-06 DIAGNOSIS — Z87891 Personal history of nicotine dependence: Secondary | ICD-10-CM | POA: Insufficient documentation

## 2013-03-06 DIAGNOSIS — M129 Arthropathy, unspecified: Secondary | ICD-10-CM | POA: Insufficient documentation

## 2013-03-06 DIAGNOSIS — E119 Type 2 diabetes mellitus without complications: Secondary | ICD-10-CM | POA: Insufficient documentation

## 2013-03-06 DIAGNOSIS — Z880 Allergy status to penicillin: Secondary | ICD-10-CM | POA: Insufficient documentation

## 2013-03-06 DIAGNOSIS — I10 Essential (primary) hypertension: Secondary | ICD-10-CM | POA: Insufficient documentation

## 2013-03-06 DIAGNOSIS — K219 Gastro-esophageal reflux disease without esophagitis: Secondary | ICD-10-CM | POA: Insufficient documentation

## 2013-03-06 DIAGNOSIS — Z79899 Other long term (current) drug therapy: Secondary | ICD-10-CM | POA: Insufficient documentation

## 2013-03-06 DIAGNOSIS — Z862 Personal history of diseases of the blood and blood-forming organs and certain disorders involving the immune mechanism: Secondary | ICD-10-CM | POA: Insufficient documentation

## 2013-03-06 DIAGNOSIS — Z8639 Personal history of other endocrine, nutritional and metabolic disease: Secondary | ICD-10-CM | POA: Insufficient documentation

## 2013-03-06 DIAGNOSIS — N453 Epididymo-orchitis: Secondary | ICD-10-CM | POA: Insufficient documentation

## 2013-03-06 LAB — CBC WITH DIFFERENTIAL/PLATELET
BASOS ABS: 0 10*3/uL (ref 0.0–0.1)
BASOS PCT: 0 % (ref 0–1)
EOS ABS: 0.1 10*3/uL (ref 0.0–0.7)
EOS PCT: 1 % (ref 0–5)
HEMATOCRIT: 43.2 % (ref 39.0–52.0)
HEMOGLOBIN: 15.2 g/dL (ref 13.0–17.0)
Lymphocytes Relative: 11 % — ABNORMAL LOW (ref 12–46)
Lymphs Abs: 1.7 10*3/uL (ref 0.7–4.0)
MCH: 30.6 pg (ref 26.0–34.0)
MCHC: 35.2 g/dL (ref 30.0–36.0)
MCV: 87.1 fL (ref 78.0–100.0)
MONO ABS: 1.3 10*3/uL — AB (ref 0.1–1.0)
MONOS PCT: 9 % (ref 3–12)
Neutro Abs: 11.6 10*3/uL — ABNORMAL HIGH (ref 1.7–7.7)
Neutrophils Relative %: 79 % — ABNORMAL HIGH (ref 43–77)
Platelets: 174 10*3/uL (ref 150–400)
RBC: 4.96 MIL/uL (ref 4.22–5.81)
RDW: 12.8 % (ref 11.5–15.5)
WBC: 14.6 10*3/uL — ABNORMAL HIGH (ref 4.0–10.5)

## 2013-03-06 LAB — URINALYSIS, ROUTINE W REFLEX MICROSCOPIC
BILIRUBIN URINE: NEGATIVE
Glucose, UA: 100 mg/dL — AB
KETONES UR: NEGATIVE mg/dL
NITRITE: NEGATIVE
Protein, ur: NEGATIVE mg/dL
Specific Gravity, Urine: 1.017 (ref 1.005–1.030)
Urobilinogen, UA: 0.2 mg/dL (ref 0.0–1.0)
pH: 5.5 (ref 5.0–8.0)

## 2013-03-06 LAB — BASIC METABOLIC PANEL
BUN: 13 mg/dL (ref 6–23)
CO2: 25 mEq/L (ref 19–32)
CREATININE: 0.93 mg/dL (ref 0.50–1.35)
Calcium: 9.5 mg/dL (ref 8.4–10.5)
Chloride: 99 mEq/L (ref 96–112)
GFR calc Af Amer: >90 mL/min (ref >90)
GFR, EST NON AFRICAN AMERICAN: 89 mL/min — AB (ref >90)
Glucose, Bld: 182 mg/dL — ABNORMAL HIGH (ref 70–99)
Potassium: 4.1 mEq/L (ref 3.7–5.3)
Sodium: 138 mEq/L (ref 137–147)

## 2013-03-06 LAB — URINE MICROSCOPIC-ADD ON

## 2013-03-06 MED ORDER — SODIUM CHLORIDE 0.9 % IV SOLN
1000.0000 mL | Freq: Once | INTRAVENOUS | Status: AC
  Administered 2013-03-06: 1000 mL via INTRAVENOUS

## 2013-03-06 MED ORDER — SODIUM CHLORIDE 0.9 % IV SOLN
1000.0000 mL | INTRAVENOUS | Status: DC
  Administered 2013-03-06: 1000 mL via INTRAVENOUS

## 2013-03-06 MED ORDER — HYDROCODONE-ACETAMINOPHEN 10-325 MG PO TABS: 1.0000 | ORAL_TABLET | Freq: Four times a day (QID) | ORAL | Status: AC | PRN

## 2013-03-06 MED ORDER — HYDROMORPHONE HCL PF 1 MG/ML IJ SOLN
1.0000 mg | INTRAMUSCULAR | Status: DC | PRN
  Administered 2013-03-06: 1 mg via INTRAVENOUS
  Filled 2013-03-06: qty 1

## 2013-03-06 MED ORDER — CIPROFLOXACIN HCL 500 MG PO TABS
500.0000 mg | ORAL_TABLET | Freq: Two times a day (BID) | ORAL | Status: DC
Start: 2013-03-06 — End: 2013-03-21

## 2013-03-06 MED ORDER — CIPROFLOXACIN IN D5W 400 MG/200ML IV SOLN
400.0000 mg | Freq: Two times a day (BID) | INTRAVENOUS | Status: DC
  Administered 2013-03-06: 400 mg via INTRAVENOUS
  Filled 2013-03-06: qty 200

## 2013-03-06 MED ORDER — ONDANSETRON HCL 4 MG/2ML IJ SOLN
4.0000 mg | Freq: Once | INTRAMUSCULAR | Status: AC
  Administered 2013-03-06: 4 mg via INTRAVENOUS
  Filled 2013-03-06: qty 2

## 2013-03-06 NOTE — ED Notes (Addendum)
Pt c/o R side scrotum pain radiating into RLQ starting this morning.  Pain score 6/10.  Pt sts pain increases with activity.  Denies injury.  Pt reports having intermittent dysuria x 1 month.  Sts it typically resolves on its own.

## 2013-03-06 NOTE — ED Notes (Signed)
MD at bedside. 

## 2013-03-06 NOTE — ED Provider Notes (Signed)
CSN: [id number]     Arrival date & time 03/06/13  1050 History  First MD Initiated Contact with Patient 03/06/13 1118     Chief Complaint  Patient presents with  . Groin Pain    HPI Patient presents to the emergency room with complaints of right testicle pain. Patient has been having intermittent mild symptoms for the last month. They have not been significant enough for him to see his doctor. This morning however the pain in his right testicle and scrotum became more severe. The pain seems to radiate towards his right lower abdomen. The pain is sharp and increases with movement and palpation. He denies any injury. He has not had any nausea or vomiting. Has not had any changes in appetite. He denies any fever. Has noted some discomfort with urination off and on over the last month. Past Medical History  Diagnosis Date  . Arthritis   . Osteoporosis   . High cholesterol   . Hypertension   . H/O hiatal hernia   . GERD (gastroesophageal reflux disease)   . Diabetes mellitus without complication    Past Surgical History  Procedure Laterality Date  . Back surgery    . Cervical spine surgery    . Colonoscopy      12 years ago  . Esophagogastroduodenoscopy      10 years ago  . Anterior cervical decomp/discectomy fusion N/A 11/02/2012    Procedure: ANTERIOR CERVICAL DECOMPRESSION/DISCECTOMY FUSION 1 LEVEL/HARDWARE REMOVAL;  Surgeon: Cristi LoronJeffrey D Jenkins, MD;  Location: [facility];  Service: Neurosurgery;  Laterality: N/A;  C34 anterior cervical decompression with fusion interbody prosthesis plating and bonegraft with exploration of fusion and removal of old plate   History reviewed. No pertinent family history. History  Substance Use Topics  . Smoking status: Former Smoker    Quit date: 02/21/2002  . Smokeless tobacco: Never Used  . Alcohol Use: No     Comment: None since 1997    Review of Systems  All other systems reviewed and are negative.      Allergies  Amoxicillin;  Celecoxib; Cephalexin; Lisinopril; Oxycodone-acetaminophen; Penicillins; Risedronate sodium; Statins; Tramadol hcl; and Vascepa  Home Medications   Current Outpatient Rx  Name  Route  Sig  Dispense  Refill  . Calcium Carbonate-Vitamin D (CALTRATE 600+D) 600-400 MG-UNIT per tablet   Oral   Take 1 tablet by mouth daily.         . cholecalciferol (VITAMIN D) 1000 UNITS tablet   Oral   Take 1,000 Units by mouth daily.         . cyclobenzaprine (FLEXERIL) 10 MG tablet   Oral   Take 10 mg by mouth at bedtime as needed for muscle spasms.         . fish oil-omega-3 fatty acids 1000 MG capsule   Oral   Take 2 g by mouth daily.          Marland Kitchen. HYDROcodone-acetaminophen (NORCO) 10-325 MG per tablet   Oral   Take 1 tablet by mouth every 6 (six) hours as needed for severe pain.          . ranitidine (ZANTAC) 150 MG tablet   Oral   Take 150 mg by mouth daily.         . ciprofloxacin (CIPRO) 500 MG tablet   Oral   Take 1 tablet (500 mg total) by mouth every 12 (twelve) hours.   28 tablet   0   . HYDROcodone-acetaminophen (NORCO) 10-325 MG per  tablet   Oral   Take 1 tablet by mouth every 6 (six) hours as needed.   20 tablet   0    BP 136/75  Pulse 85  Temp(Src) 98.9 F (37.2 C) (Oral)  Resp 14  SpO2 93% Physical Exam  Nursing note and vitals reviewed. Constitutional: He appears well-developed and well-nourished. No distress.  HENT:  Head: Normocephalic and atraumatic.  Right Ear: External ear normal.  Left Ear: External ear normal.  Eyes: Conjunctivae are normal. Right eye exhibits no discharge. Left eye exhibits no discharge. No scleral icterus.  Neck: Neck supple. No tracheal deviation present.  Cardiovascular: Normal rate, regular rhythm and intact distal pulses.   Pulmonary/Chest: Effort normal and breath sounds normal. No stridor. No respiratory distress. He has no wheezes. He has no rales.  Abdominal: Soft. Bowel sounds are normal. He exhibits no distension.  There is no tenderness. There is no rebound and no guarding. Hernia confirmed negative in the right inguinal area and confirmed negative in the left inguinal area.  Genitourinary: Right testis shows tenderness. Right testis shows no mass and no swelling. Left testis shows no mass, no swelling and no tenderness. Penile tenderness present. No penile erythema. No discharge found.  Musculoskeletal: He exhibits no edema and no tenderness.  Neurological: He is alert. He has normal strength. No cranial nerve deficit (no facial droop, extraocular movements intact, no slurred speech) or sensory deficit. He exhibits normal muscle tone. He displays no seizure activity. Coordination normal.  Skin: Skin is warm and dry. No rash noted.  Psychiatric: He has a normal mood and affect.    ED Course  Procedures (including critical care time) Labs Review Labs Reviewed  CBC WITH DIFFERENTIAL - Abnormal; Notable for the following:    WBC 14.6 (*)    Neutrophils Relative % 79 (*)    Neutro Abs 11.6 (*)    Lymphocytes Relative 11 (*)    Monocytes Absolute 1.3 (*)    All other components within normal limits  URINALYSIS, ROUTINE W REFLEX MICROSCOPIC - Abnormal; Notable for the following:    APPearance CLOUDY (*)    Glucose, UA 100 (*)    Hgb urine dipstick SMALL (*)    Leukocytes, UA LARGE (*)    All other components within normal limits  BASIC METABOLIC PANEL - Abnormal; Notable for the following:    Glucose, Bld 182 (*)    GFR calc non Af Amer 89 (*)    All other components within normal limits  URINE MICROSCOPIC-ADD ON - Abnormal; Notable for the following:    Bacteria, UA FEW (*)    Crystals CA OXALATE CRYSTALS (*)    All other components within normal limits   Imaging Review US Scrotum  03/06/2013   CLINICAL DATA:  Pain.  EXAM: SCROTAL ULTRASOUND  DOPPLER ULTRASOUND OF THE TESTICLES  TECHNIQUE: Complete ultrasound examination of the testicles, epididymis, and other scrotal structures was performed.  Color and spectral Doppler ultrasound were also utilized to evaluate blood flow to the testicles.  COMPARISON:  None.  FINDINGS: Right testicle  Measurements: 4.1 x 2.1 x 3.0 cm. No mass or microlithiasis visualized.  Left testicle  Measurements: 3.7 x 2.0 x 2.4 cm. No mass or microlithiasis visualized.  Right epididymis:  Increased blood flow by color Doppler exam.  Left epididymis:  Normal in size and appearance.  Hydrocele:  Small bilateral hydroceles.  Varicocele:  Small left varicocele.  Pulsed Doppler interrogation of both testes demonstrates low resistance arterial and venous  waveforms bilaterally.  IMPRESSION: 1. No evidence of testicular torsion. 2. Increased blood flow right epididymis, epididymitis could present in this fashion. 3. Small bilateral hydroceles. 4. Small left varicocele.   Electronically Signed   By: Maisie Fus  Register   On: 03/06/2013 14:42    Medications  0.9 %  sodium chloride infusion (0 mLs Intravenous Stopped 03/06/13 1359)    Followed by  0.9 %  sodium chloride infusion (1,000 mLs Intravenous New Bag/Given 03/06/13 1356)  HYDROmorphone (DILAUDID) injection 1 mg (1 mg Intravenous Given 03/06/13 1241)  ciprofloxacin (CIPRO) IVPB 400 mg (400 mg Intravenous New Bag/Given 03/06/13 1356)  ondansetron (ZOFRAN) injection 4 mg (4 mg Intravenous Given 03/06/13 1241)    MDM   Final diagnoses:  Epididymitis    Will treat for epidymidis.  No mass or other abnormality on [place].  IV cipro given in the ED considering his allergies.  Follow up with PCP    Celene Kras, MD 03/06/13 [id number]

## 2013-03-06 NOTE — Discharge Instructions (Signed)
Epididymitis  Epididymitis is a swelling (inflammation) of the epididymis. The epididymis is a cord-like structure along the back part of the testicle. Epididymitis is usually, but not always, caused by infection. This is usually a sudden problem beginning with chills, fever and pain behind the scrotum and in the testicle. There may be swelling and redness of the testicle.  DIAGNOSIS   Physical examination will reveal a tender, swollen epididymis. Sometimes, cultures are obtained from the urine or from prostate secretions to help find out if there is an infection or if the cause is a different problem. Sometimes, blood work is performed to see if your white blood cell count is elevated and if a germ (bacterial) or viral infection is present. Using this knowledge, an appropriate medicine which kills germs (antibiotic) can be chosen by your caregiver. A viral infection causing epididymitis will most often go away (resolve) without treatment.  HOME CARE INSTRUCTIONS   · Hot sitz baths for 20 minutes, 4 times per day, may help relieve pain.  · Only take over-the-counter or prescription medicines for pain, discomfort or fever as directed by your caregiver.  · Take all medicines, including antibiotics, as directed. Take the antibiotics for the full prescribed length of time even if you are feeling better.  · It is very important to keep all follow-up appointments.  SEEK IMMEDIATE MEDICAL CARE IF:   · You have a fever.  · You have pain not relieved with medicines.  · You have any worsening of your problems.  · Your pain seems to come and go.  · You develop pain, redness, and swelling in the scrotum and surrounding areas.  MAKE SURE YOU:   · Understand these instructions.  · Will watch your condition.  · Will get help right away if you are not doing well or get worse.  Document Released: 01/02/2000 Document Revised: 03/29/2011 Document Reviewed: 11/21/2008  ExitCare® Patient Information ©2014 [employer].

## 2013-03-13 ENCOUNTER — Ambulatory Visit (INDEPENDENT_AMBULATORY_CARE_PROVIDER_SITE_OTHER): Payer: Medicare Other | Admitting: Emergency Medicine

## 2013-03-13 VITALS — BP 130/80 | HR 75 | Temp 98.2°F | Resp 18 | Ht 69.0 in | Wt 186.0 lb

## 2013-03-13 DIAGNOSIS — N453 Epididymo-orchitis: Secondary | ICD-10-CM

## 2013-03-13 DIAGNOSIS — N451 Epididymitis: Secondary | ICD-10-CM

## 2013-03-13 NOTE — Progress Notes (Signed)
[facility] [address], [place] [id number] [phone]  Date:  03/13/2013   Name:  Blake DoveRoger D Yannuzzi   DOB:  1950-03-31   MRN:  [id number]  PCP:  BEAN,BILLIE-LYNN, PA-C    Chief Complaint: Follow-up   History of Present Illness:  Blake Miller is a 63 y.o. very pleasant male patient who presents with the following:  In ER a week ago with epididymitis and put on cipro.  Half way through his course of cipro and has far less pain and no swelling or dysuria.  No fever or chills.  Denies other complaint or health concern today.   Patient Active Problem List   Diagnosis Date Noted  . STREPTOCOCCAL PHARYNGITIS 07/18/2008  . EAR PAIN, LEFT 07/18/2008  . HYPERTENSION 06/04/2008  . OTITIS EXTERNA, ACUTE, RIGHT 05/01/2008  . SHOULDER PAIN, RIGHT 03/18/2008  . MUSCLE CRAMPS 12/22/2007  . CERVICALGIA 06/26/2007  . LUMBAGO 06/26/2007  . HEARING LOSS 05/23/2007  . Adv Eff Med/Biol Sub NOS 05/23/2007  . OBESITY 01/09/2007  . 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 DIABETES MELLITUS, TYPE II 07/11/2006  . HYPERLIPIDEMIA 07/11/2006  . 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 OSTEOPOROSIS 05/26/2006    Past Medical History  Diagnosis Date  . Arthritis   . Osteoporosis   . High cholesterol   . Hypertension   . H/O hiatal hernia   . GERD (gastroesophageal reflux disease)   . Diabetes mellitus without complication     Past Surgical History  Procedure Laterality Date  . Back surgery    . Cervical spine surgery    . Colonoscopy      12 years ago  . Esophagogastroduodenoscopy      10 years ago  . Anterior cervical decomp/discectomy fusion N/A 11/02/2012    Procedure: ANTERIOR CERVICAL DECOMPRESSION/DISCECTOMY FUSION 1 LEVEL/HARDWARE REMOVAL;  Surgeon: Cristi LoronJeffrey D Jenkins, MD;  Location: [facility];  Service: Neurosurgery;  Laterality: N/A;  C34 anterior cervical decompression with fusion interbody prosthesis plating and bonegraft with exploration of fusion and  removal of old plate    History  Substance Use Topics  . Smoking status: Former Smoker    Quit date: 02/21/2002  . Smokeless tobacco: Never Used  . Alcohol Use: No     Comment: None since 1997    History reviewed. No pertinent family history.  Allergies  Allergen Reactions  . Amoxicillin     REACTION: unknown  . Celecoxib     REACTION: headache  . Cephalexin     REACTION: unknown  . Lisinopril     REACTION: felt bad, muscle cramps, stomach pain  . Oxycodone-Acetaminophen     REACTION: generic, headache  . Penicillins     REACTION: unknown  . Risedronate Sodium     REACTION: abd pain and muscle aches  . Statins     Stomach ache, joint pain   . Tramadol Hcl     REACTION: diarrhea  . Vascepa [Epa Ethyl Ester]     Cramping. Stomach aches.      Medication list has been reviewed and updated.  Current Outpatient Prescriptions on File Prior to Visit  Medication Sig Dispense Refill  . Calcium Carbonate-Vitamin D (CALTRATE 600+D) 600-400 MG-UNIT per tablet Take 1 tablet by mouth daily.      . cholecalciferol (VITAMIN D) 1000 UNITS tablet Take 1,000 Units by mouth daily.      . ciprofloxacin (CIPRO) 500 MG tablet Take 1 tablet (500 mg total) by mouth every  12 (twelve) hours.  28 tablet  0  . cyclobenzaprine (FLEXERIL) 10 MG tablet Take 10 mg by mouth at bedtime as needed for muscle spasms.      . fish oil-omega-3 fatty acids 1000 MG capsule Take 2 g by mouth daily.       Marland Kitchen HYDROcodone-acetaminophen (NORCO) 10-325 MG per tablet Take 1 tablet by mouth every 6 (six) hours as needed for severe pain.       Marland Kitchen HYDROcodone-acetaminophen (NORCO) 10-325 MG per tablet Take 1 tablet by mouth every 6 (six) hours as needed.  20 tablet  0  . ranitidine (ZANTAC) 150 MG tablet Take 150 mg by mouth daily.       No current facility-administered medications on file prior to visit.    Review of Systems:  As per HPI, otherwise negative.    Physical Examination: Filed Vitals:   03/13/13  0945  BP: 130/80  Pulse: 75  Temp: 98.2 F (36.8 C)  Resp: 18   Filed Vitals:   03/13/13 0945  Height: 5\' 9"  (1.753 m)  Weight: 186 lb (84.369 kg)   Body mass index is 27.45 kg/(m^2). Ideal Body Weight: Weight in (lb) to have BMI = 25: 168.9   GEN: WDWN, NAD, Non-toxic, Alert & Oriented x 3 HEENT: Atraumatic, Normocephalic.  Ears and Nose: No external deformity. EXTR: No clubbing/cyanosis/edema NEURO: Normal gait.  PSYCH: Normally interactive. Conversant. Not depressed or anxious appearing.  Calm demeanor.  GENITALIA:  Normal male circumcised.  Minimal tenderness in right testicle.  Cord and epididymis normal  Assessment and Plan: Epididymis resolving Continue antibiotic until gone. Follow up as needed   Signed,  Phillips Odor, MD

## 2013-03-13 NOTE — Patient Instructions (Signed)
Epididymitis  Epididymitis is a swelling (inflammation) of the epididymis. The epididymis is a cord-like structure along the back part of the testicle. Epididymitis is usually, but not always, caused by infection. This is usually a sudden problem beginning with chills, fever and pain behind the scrotum and in the testicle. There may be swelling and redness of the testicle.  DIAGNOSIS   Physical examination will reveal a tender, swollen epididymis. Sometimes, cultures are obtained from the urine or from prostate secretions to help find out if there is an infection or if the cause is a different problem. Sometimes, blood work is performed to see if your white blood cell count is elevated and if a germ (bacterial) or viral infection is present. Using this knowledge, an appropriate medicine which kills germs (antibiotic) can be chosen by your caregiver. A viral infection causing epididymitis will most often go away (resolve) without treatment.  HOME CARE INSTRUCTIONS   · Hot sitz baths for 20 minutes, 4 times per day, may help relieve pain.  · Only take over-the-counter or prescription medicines for pain, discomfort or fever as directed by your caregiver.  · Take all medicines, including antibiotics, as directed. Take the antibiotics for the full prescribed length of time even if you are feeling better.  · It is very important to keep all follow-up appointments.  SEEK IMMEDIATE MEDICAL CARE IF:   · You have a fever.  · You have pain not relieved with medicines.  · You have any worsening of your problems.  · Your pain seems to come and go.  · You develop pain, redness, and swelling in the scrotum and surrounding areas.  MAKE SURE YOU:   · Understand these instructions.  · Will watch your condition.  · Will get help right away if you are not doing well or get worse.  Document Released: 01/02/2000 Document Revised: 03/29/2011 Document Reviewed: 11/21/2008  ExitCare® Patient Information ©2014 [employer].

## 2013-03-21 ENCOUNTER — Ambulatory Visit (INDEPENDENT_AMBULATORY_CARE_PROVIDER_SITE_OTHER): Payer: Medicare Other | Admitting: Family Medicine

## 2013-03-21 VITALS — BP 120/80 | HR 96 | Temp 98.0°F | Resp 16 | Ht 69.0 in | Wt 193.0 lb

## 2013-03-21 DIAGNOSIS — L03012 Cellulitis of left finger: Secondary | ICD-10-CM

## 2013-03-21 DIAGNOSIS — Z23 Encounter for immunization: Secondary | ICD-10-CM

## 2013-03-21 DIAGNOSIS — L02519 Cutaneous abscess of unspecified hand: Secondary | ICD-10-CM

## 2013-03-21 DIAGNOSIS — L03019 Cellulitis of unspecified finger: Secondary | ICD-10-CM

## 2013-03-21 MED ORDER — DOXYCYCLINE HYCLATE 100 MG PO TABS: 100.0000 mg | ORAL_TABLET | Freq: Two times a day (BID) | ORAL | Status: DC

## 2013-03-21 NOTE — Patient Instructions (Addendum)
Please come in tomorrow morning and see Dr. Neva SeatGreene- come fairly early in the day in case we need to have you see a hand specialist tomorrow.    Start taking the doxycycline twice a day- you will take this for 10 days.

## 2013-03-21 NOTE — Progress Notes (Signed)
[facility] [address] [place] [address] [phone]  Date:  03/21/2013   Name:  Blake Miller   DOB:  1950-12-28   MRN:  [id number]  PCP:  BEAN,BILLIE-LYNN, PA-C    Chief Complaint: Hand Pain   History of Present Illness:  Blake Miller is a 63 y.o. very pleasant male patient who presents with the following:  He is here today with a left index finger problem- he noted it was a little bit tender on the tip yesterday, looked like "a spot of dirt" under the nail.  He did not think much of it, but overnight a large blood blister developed and now the entire finger is warm and tender.  He is not aware of any injury- he did not shut it in a door, etc.  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  He has not noted any fever- otherwise he feels good.    He has not noted any other bleeding or bruising.  He finished cipro yesterday for epididymitis.  He is unfortunately allergic to penicillin, amox and keflex.    Patient Active Problem List   Diagnosis Date Noted  . STREPTOCOCCAL PHARYNGITIS 07/18/2008  . EAR PAIN, LEFT 07/18/2008  . HYPERTENSION 06/04/2008  . OTITIS EXTERNA, ACUTE, RIGHT 05/01/2008  . SHOULDER PAIN, RIGHT 03/18/2008  . MUSCLE CRAMPS 12/22/2007  . CERVICALGIA 06/26/2007  . LUMBAGO 06/26/2007  . HEARING LOSS 05/23/2007  . Adv Eff Med/Biol Sub NOS 05/23/2007  . OBESITY 01/09/2007  . 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 DIABETES MELLITUS, TYPE II 07/11/2006  . HYPERLIPIDEMIA 07/11/2006  . 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 OSTEOPOROSIS 05/26/2006    Past Medical History  Diagnosis Date  . Arthritis   . Osteoporosis   . High cholesterol   . Hypertension   . H/O hiatal hernia   . GERD (gastroesophageal reflux disease)   . Diabetes mellitus without complication     Past Surgical History  Procedure Laterality Date  . Back surgery    . Cervical spine surgery     . Colonoscopy      12 years ago  . Esophagogastroduodenoscopy      10 years ago  . Anterior cervical decomp/discectomy fusion N/A 11/02/2012    Procedure: ANTERIOR CERVICAL DECOMPRESSION/DISCECTOMY FUSION 1 LEVEL/HARDWARE REMOVAL;  Surgeon: Cristi Loron, MD;  Location: [facility];  Service: Neurosurgery;  Laterality: N/A;  C34 anterior cervical decompression with fusion interbody prosthesis plating and bonegraft with exploration of fusion and removal of old plate    History  Substance Use Topics  . Smoking status: Former Smoker    Quit date: 02/21/2002  . Smokeless tobacco: Never Used  . Alcohol Use: No     Comment: None since 1997    History reviewed. No pertinent family history.  Allergies  Allergen Reactions  . Amoxicillin     REACTION: unknown  . Celecoxib     REACTION: headache  . Cephalexin     REACTION: unknown  . Lisinopril     REACTION: felt bad, muscle cramps, stomach pain  . Oxycodone-Acetaminophen     REACTION: generic, headache  . Penicillins     REACTION: unknown  . Risedronate Sodium     REACTION: abd pain and muscle aches  . Statins     Stomach ache, joint pain   . Tramadol Hcl     REACTION: diarrhea  .  Vascepa [Epa Ethyl Ester]     Cramping. Stomach aches.      Medication list has been reviewed and updated.  Current Outpatient Prescriptions on File Prior to Visit  Medication Sig Dispense Refill  . Calcium Carbonate-Vitamin D (CALTRATE 600+D) 600-400 MG-UNIT per tablet Take 1 tablet by mouth daily.      . cholecalciferol (VITAMIN D) 1000 UNITS tablet Take 1,000 Units by mouth daily.      . cyclobenzaprine (FLEXERIL) 10 MG tablet Take 10 mg by mouth at bedtime as needed for muscle spasms.      . fish oil-omega-3 fatty acids 1000 MG capsule Take 2 g by mouth daily.       Marland Kitchen HYDROcodone-acetaminophen (NORCO) 10-325 MG per tablet Take 1 tablet by mouth every 6 (six) hours as needed for severe pain.       Marland Kitchen HYDROcodone-acetaminophen (NORCO)  10-325 MG per tablet Take 1 tablet by mouth every 6 (six) hours as needed.  20 tablet  0  . ranitidine (ZANTAC) 150 MG tablet Take 150 mg by mouth daily.      . ciprofloxacin (CIPRO) 500 MG tablet Take 1 tablet (500 mg total) by mouth every 12 (twelve) hours.  28 tablet  0   No current facility-administered medications on file prior to visit.    Review of Systems:  As per HPI- otherwise negative.   Physical Examination: Filed Vitals:   03/21/13 1500  BP: 120/80  Pulse: 96  Temp: 98 F (36.7 C)  Resp: 16   Filed Vitals:   03/21/13 1500  Height: 5\' 9"  (1.753 m)  Weight: 193 lb (87.544 kg)   Body mass index is 28.49 kg/(m^2). Ideal Body Weight: Weight in (lb) to have BMI = 25: 168.9  GEN: WDWN, NAD, Non-toxic, A & O x 3, looks well HEENT: Atraumatic, Normocephalic. Neck supple. No masses, No LAD. Ears and Nose: No external deformity. CV: RRR, No M/G/R. No JVD. No thrill. No extra heart sounds. PULM: CTA B, no wheezes, crackles, rhonchi. No retractions. No resp. distress. No accessory muscle use. EXTR: No c/c/e NEURO Normal gait.  PSYCH: Normally interactive. Conversant. Not depressed or anxious appearing.  Calm demeanor.  Right hand shows abnormality of the index finger. Approx 75% subungual hematoma noted.  There is a large "blood blister" involving the cuticle and the area just distal to the nail.  The rest of the finger and adjacent part of the hand is warm, slightly red and tender. The finger is slightly swollen.  He has full ROM of the finger but it is painful to flex.  Normal cap refill.    VC obtained.  Finger prepped with alcohol swab.  Blistered area I and D with 11 blade, culture collected of serosanguinous fluid, tinged with purulence.  Prepped nail with betadine and punctured nail with cautery in attempt to release hematoma and pain, but blood would not release.  Finger bandaged, updated tetanus shot.   Outlined red area on hand with skin marker  Assessment and  Plan: Cellulitis of left index finger - Plan: Wound culture, doxycycline (VIBRA-TABS) 100 MG tablet, Td vaccine greater than or equal to 7yo preservative free IM  Cellulitis of finger, ?atypical paronychia as above.   Alston dose have several antibiotic allergies. Start doxycycline, plan for close follow-up tomorrow.  He understands that if he is getting worse he will need to see hand surgery vs admission for IV abx.  If any concerns overnight he is to give me a call  Meds ordered this encounter  Medications  . doxycycline (VIBRA-TABS) 100 MG tablet    Sig: Take 1 tablet (100 mg total) by mouth 2 (two) times daily.    Dispense:  20 tablet    Refill:  0     Signed Abbe AmsterdamJessica Copland, MD

## 2013-03-22 ENCOUNTER — Ambulatory Visit: Payer: Medicare Other

## 2013-03-22 ENCOUNTER — Encounter: Payer: Self-pay | Admitting: Family Medicine

## 2013-03-22 ENCOUNTER — Ambulatory Visit (INDEPENDENT_AMBULATORY_CARE_PROVIDER_SITE_OTHER): Payer: Medicare Other | Admitting: Family Medicine

## 2013-03-22 VITALS — BP 120/72 | HR 84 | Temp 98.0°F | Resp 16 | Ht 69.5 in | Wt 191.0 lb

## 2013-03-22 DIAGNOSIS — M7989 Other specified soft tissue disorders: Secondary | ICD-10-CM

## 2013-03-22 DIAGNOSIS — L03019 Cellulitis of unspecified finger: Secondary | ICD-10-CM

## 2013-03-22 DIAGNOSIS — IMO0001 Reserved for inherently not codable concepts without codable children: Secondary | ICD-10-CM

## 2013-03-22 MED ORDER — CIPROFLOXACIN HCL 500 MG PO TABS: 500.0000 mg | ORAL_TABLET | Freq: Two times a day (BID) | ORAL | Status: DC

## 2013-03-22 NOTE — Progress Notes (Addendum)
Subjective:   This chart was scribed for Blake Staggers, MD by Arlan Organ, [facility] Scribe. This patient was seen in room 4 and the patient's care was started 9:13 AM.    Patient ID: Blake Miller, male    DOB: December 20, 1950, 63 y.o.   MRN: [id number]  HPI  HPI Comments: Blake Miller is a 63 y.o. male who presents to [facility] seeking follow up today from visit on 3/4. Pt was initially seen by Dr. Patsy Lager yesterday 3/4 for possible cellulitis to left index finger. Pt initially noted a spot underneath the nail the day prior followed by erythema and localized pain. During exam possible cellulitis and paronychia noted. Blister to area. Pt had an I&D performed with culture. Cautery to nail for possible subungual hematoma, but no blood released with caudery. Pt was started on Doxycycline. Here today for follow up. Pt states he has experienced some improvement from yesterday. Denies any fever or noticeably drainage at this time. He has a PMHx of osteoporosis, high cholesterol, HTN, GERD, and DM. No other concerns or complaints this visit.    Patient Active Problem List   Diagnosis Date Noted  . STREPTOCOCCAL PHARYNGITIS 07/18/2008  . EAR PAIN, LEFT 07/18/2008  . HYPERTENSION 06/04/2008  . OTITIS EXTERNA, ACUTE, RIGHT 05/01/2008  . SHOULDER PAIN, RIGHT 03/18/2008  . MUSCLE CRAMPS 12/22/2007  . CERVICALGIA 06/26/2007  . LUMBAGO 06/26/2007  . HEARING LOSS 05/23/2007  . Adv Eff Med/Biol Sub NOS 05/23/2007  . OBESITY 01/09/2007  . 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 DIABETES MELLITUS, TYPE II 07/11/2006  . HYPERLIPIDEMIA 07/11/2006  . 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 OSTEOPOROSIS 05/26/2006   Past Medical History  Diagnosis Date  . Arthritis   . Osteoporosis   . High cholesterol   . Hypertension   . H/O hiatal hernia   . GERD (gastroesophageal reflux disease)   . Diabetes mellitus without complication    Past Surgical History    Procedure Laterality Date  . Back surgery    . Cervical spine surgery    . Colonoscopy      12 years ago  . Esophagogastroduodenoscopy      10 years ago  . Anterior cervical decomp/discectomy fusion N/A 11/02/2012    Procedure: ANTERIOR CERVICAL DECOMPRESSION/DISCECTOMY FUSION 1 LEVEL/HARDWARE REMOVAL;  Surgeon: Cristi Loron, MD;  Location: [facility];  Service: Neurosurgery;  Laterality: N/A;  C34 anterior cervical decompression with fusion interbody prosthesis plating and bonegraft with exploration of fusion and removal of old plate   Allergies  Allergen Reactions  . Amoxicillin     REACTION: unknown  . Celecoxib     REACTION: headache  . Cephalexin     REACTION: unknown  . Lisinopril     REACTION: felt bad, muscle cramps, stomach pain  . Oxycodone-Acetaminophen     REACTION: generic, headache  . Penicillins     REACTION: unknown  . Risedronate Sodium     REACTION: abd pain and muscle aches  . Statins     Stomach ache, joint pain   . Tramadol Hcl     REACTION: diarrhea  . Vascepa [Epa Ethyl Ester]     Cramping. Stomach aches.     Prior to Admission medications   Medication Sig Start Date End Date Taking? Authorizing Provider  Calcium Carbonate-Vitamin D (CALTRATE 600+D) 600-400 MG-UNIT per tablet Take 1 tablet by mouth daily.   Yes Historical Provider, MD  cholecalciferol (VITAMIN D)  1000 UNITS tablet Take 1,000 Units by mouth daily.   Yes Historical Provider, MD  cyclobenzaprine (FLEXERIL) 10 MG tablet Take 10 mg by mouth at bedtime as needed for muscle spasms.   Yes Historical Provider, MD  doxycycline (VIBRA-TABS) 100 MG tablet Take 1 tablet (100 mg total) by mouth 2 (two) times daily. 03/21/13  Yes Gwenlyn FoundJessica C Copland, MD  fish oil-omega-3 fatty acids 1000 MG capsule Take 2 g by mouth daily.    Yes Historical Provider, MD  HYDROcodone-acetaminophen (NORCO) 10-325 MG per tablet Take 1 tablet by mouth every 6 (six) hours as needed for severe pain.  02/20/13  Yes  Historical Provider, MD  HYDROcodone-acetaminophen (NORCO) 10-325 MG per tablet Take 1 tablet by mouth every 6 (six) hours as needed. 03/06/13  Yes Celene KrasJon R Knapp, MD  ranitidine (ZANTAC) 150 MG tablet Take 150 mg by mouth daily.   Yes Historical Provider, MD   History   Social History  . Marital Status: Divorced    Spouse Name: N/A    Number of Children: N/A  . Years of Education: N/A   Occupational History  . Not on file.   Social History Main Topics  . Smoking status: Former Smoker    Quit date: 02/21/2002  . Smokeless tobacco: Never Used  . Alcohol Use: No     Comment: None since 1997  . Drug Use: No  . Sexual Activity: Not on file   Other Topics Concern  . Not on file   Social History Narrative  . No narrative on file     Review of Systems  Constitutional: Negative for fever and chills.  HENT: Negative for congestion.   Eyes: Negative for redness.  Respiratory: Negative for cough.   Skin: Positive for wound.  Neurological: Negative for numbness.  Psychiatric/Behavioral: Negative for confusion.    Filed Vitals:   03/22/13 0858  BP: 120/72  Pulse: 84  Temp: 98 F (36.7 C)  TempSrc: Oral  Resp: 16  Height: 5' 9.5" (1.765 m)  Weight: 191 lb (86.637 kg)  SpO2: 97%    Objective:   Physical Exam  Nursing note and vitals reviewed. Constitutional: He is oriented to person, place, and time. He appears well-developed and well-nourished.  HENT:  Head: Normocephalic and atraumatic.  Eyes: EOM are normal.  Neck: Normal range of motion.  Cardiovascular: Normal rate.   Pulmonary/Chest: Effort normal.  Musculoskeletal: Normal range of motion.  Erythema to top of left 2nd digit and distal dorsal hand-  Slight receding from outline yesterday.  Slight erythema and faint echymosis with fluctulent superficial area below the nail and medial to the distal Phalynx nail approximately 1 cm Clear to yellow serous drainage from dorsal opening capillary refill to distal  phalynx less then 1 sec.   Neurological: He is alert and oriented to person, place, and time.  Skin: Skin is warm and dry.  Psychiatric: He has a normal mood and affect. His behavior is normal.    [facility] reading (PRIMARY) by  Dr. Neva SeatGreene: L 2nd phalynx: no fx, no apparent metallic fb, but small soft tissue density lateral to 2nd distal phalynx.      Assessment & Plan:   Blake DoveRoger D Feuerstein is a 63 y.o. male Swelling of finger - Plan: DG Finger Index Left, ciprofloxacin (CIPRO) 500 MG tablet  Paronychia of second finger of left hand - Plan: ciprofloxacin (CIPRO) 500 MG tablet  Improved mvmt today, no fever and slight regression of erythema.  Clear-yellow fluid wih small  amount of exudate expressed from wound. 2nd md exam. Will add cipro for pseudomonas coverage. Continue doxycycline. recehck with Dr. Alwyn Ren tomorrow. Ok to try soaks to area few times today. Rtc/er precautions discussed.   Meds ordered this encounter  Medications  . ciprofloxacin (CIPRO) 500 MG tablet    Sig: Take 1 tablet (500 mg total) by mouth 2 (two) times daily.    Dispense:  20 tablet    Refill:  0   Patient Instructions  Continue doxycycline, adding other antibiotic fr other possible bacteria. Keep covered, and warm water soaks, followed by gentle pressure few times between today and tomorrow. Return to the clinic or go to the nearest emergency room if any of your symptoms worsen or new symptoms occur. Paronychia Paronychia is an inflammatory reaction involving the folds of the skin surrounding the fingernail. This is commonly caused by an infection in the skin around a nail. The most common cause of paronychia is frequent wetting of the hands (as seen with bartenders, food servers, nurses or others who wet their hands). This makes the skin around the fingernail susceptible to infection by bacteria (germs) or fungus. Other predisposing factors are:  Aggressive manicuring.  Nail biting.  Thumb sucking. The most common  cause is a staphylococcal (a type of germ) infection, or a fungal (Candida) infection. When caused by a germ, it usually comes on suddenly with redness, swelling, pus and is often painful. It may get under the nail and form an abscess (collection of pus), or form an abscess around the nail. If the nail itself is infected with a fungus, the treatment is usually prolonged and may require oral medicine for up to one year. Your caregiver will determine the length of time treatment is required. The paronychia caused by bacteria (germs) may largely be avoided by not pulling on hangnails or picking at cuticles. When the infection occurs at the tips of the finger it is called felon. When the cause of paronychia is from the herpes simplex virus (HSV) it is called herpetic whitlow. TREATMENT  When an abscess is present treatment is often incision and drainage. This means that the abscess must be cut open so the pus can get out. When this is done, the following home care instructions should be followed. HOME CARE INSTRUCTIONS   It is important to keep the affected fingers very dry. Rubber or plastic gloves over cotton gloves should be used whenever the hand must be placed in water.  Keep wound clean, dry and dressed as suggested by your caregiver between warm soaks or warm compresses.  Soak in warm water for fifteen to twenty minutes three to four times per day for bacterial infections. Fungal infections are very difficult to treat, so often require treatment for long periods of time.  For bacterial (germ) infections take antibiotics (medicine which kill germs) as directed and finish the prescription, even if the problem appears to be solved before the medicine is gone.  Only take over-the-counter or prescription medicines for pain, discomfort, or fever as directed by your caregiver. SEEK IMMEDIATE MEDICAL CARE IF:  You have redness, swelling, or increasing pain in the wound.  You notice pus coming from the  wound.  You have a fever.  You notice a bad smell coming from the wound or dressing. Document Released: 06/30/2000 Document Revised: 03/29/2011 Document Reviewed: 03/01/2008 [facility] Patient Information 2014 [place], [place].       I personally performed the services described in this documentation, which was scribed in  my presence. The recorded information has been reviewed and is accurate.

## 2013-03-22 NOTE — Patient Instructions (Signed)
Continue doxycycline, adding other antibiotic fr other possible bacteria. Keep covered, and warm water soaks, followed by gentle pressure few times between today and tomorrow. Return to the clinic or go to the nearest emergency room if any of your symptoms worsen or new symptoms occur. Paronychia Paronychia is an inflammatory reaction involving the folds of the skin surrounding the fingernail. This is commonly caused by an infection in the skin around a nail. The most common cause of paronychia is frequent wetting of the hands (as seen with bartenders, food servers, nurses or others who wet their hands). This makes the skin around the fingernail susceptible to infection by bacteria (germs) or fungus. Other predisposing factors are:  Aggressive manicuring.  Nail biting.  Thumb sucking. The most common cause is a staphylococcal (a type of germ) infection, or a fungal (Candida) infection. When caused by a germ, it usually comes on suddenly with redness, swelling, pus and is often painful. It may get under the nail and form an abscess (collection of pus), or form an abscess around the nail. If the nail itself is infected with a fungus, the treatment is usually prolonged and may require oral medicine for up to one year. Your caregiver will determine the length of time treatment is required. The paronychia caused by bacteria (germs) may largely be avoided by not pulling on hangnails or picking at cuticles. When the infection occurs at the tips of the finger it is called felon. When the cause of paronychia is from the herpes simplex virus (HSV) it is called herpetic whitlow. TREATMENT  When an abscess is present treatment is often incision and drainage. This means that the abscess must be cut open so the pus can get out. When this is done, the following home care instructions should be followed. HOME CARE INSTRUCTIONS   It is important to keep the affected fingers very dry. Rubber or plastic gloves over  cotton gloves should be used whenever the hand must be placed in water.  Keep wound clean, dry and dressed as suggested by your caregiver between warm soaks or warm compresses.  Soak in warm water for fifteen to twenty minutes three to four times per day for bacterial infections. Fungal infections are very difficult to treat, so often require treatment for long periods of time.  For bacterial (germ) infections take antibiotics (medicine which kill germs) as directed and finish the prescription, even if the problem appears to be solved before the medicine is gone.  Only take over-the-counter or prescription medicines for pain, discomfort, or fever as directed by your caregiver. SEEK IMMEDIATE MEDICAL CARE IF:  You have redness, swelling, or increasing pain in the wound.  You notice pus coming from the wound.  You have a fever.  You notice a bad smell coming from the wound or dressing. Document Released: 06/30/2000 Document Revised: 03/29/2011 Document Reviewed: 03/01/2008 [facility] Patient Information 2014 [place].

## 2013-03-23 ENCOUNTER — Ambulatory Visit (INDEPENDENT_AMBULATORY_CARE_PROVIDER_SITE_OTHER): Payer: Medicare Other | Admitting: Family Medicine

## 2013-03-23 VITALS — BP 126/78 | HR 78 | Temp 98.4°F | Resp 18 | Ht 69.0 in | Wt 187.2 lb

## 2013-03-23 DIAGNOSIS — L02519 Cutaneous abscess of unspecified hand: Secondary | ICD-10-CM

## 2013-03-23 DIAGNOSIS — L03019 Cellulitis of unspecified finger: Secondary | ICD-10-CM

## 2013-03-23 DIAGNOSIS — L089 Local infection of the skin and subcutaneous tissue, unspecified: Secondary | ICD-10-CM

## 2013-03-23 MED ORDER — MUPIROCIN 2 % EX OINT: 1.0000 "application " | TOPICAL_OINTMENT | Freq: Two times a day (BID) | CUTANEOUS | Status: DC

## 2013-03-23 NOTE — Patient Instructions (Signed)
Use mupirocin ointment on finger twice daily after gently washing it  Return tomorrow afternoon and see Dr. Chilton SiGreen for followup  Continue other medications.

## 2013-03-23 NOTE — Progress Notes (Signed)
Subjective: Patient is here for recheck of a left index finger infection. It is not as painful as it was yesterday. Dr. Neva SeatGreene had asked me to recheck him. I briefly seen yesterday.  Objective: Surrounding cellulitis is resolved, however the blistered area surrounding the left index fingernail is still draining pus which actually looks a little thickening yesterday.   Procedue: The overlying scab was gently debrided using a needle, pickups, and small curved scissors. No anesthesia was needed. There is a little deeper ulceration adjacent to the nail bed laterally. There still is infection underneath the nail, and appears to this could cause him to lose the nail. There is little chance of much pus accumulating under they're now. The wound was dressed with mupirocin ointment. The patient tolerated the procedure well  Assessment: Left index finger abscess and cellulitis, debrided  Plan: Followup again tomorrow by Dr. Neva SeatGreene. Fast track card. I can see him also if needed since I will be here tomorrow also. He was told that he may eventually lose the index fingernail, and then if it comes loose partially but not completely he would need to come in to have a little numbing medicine and have us remove it

## 2013-03-24 ENCOUNTER — Ambulatory Visit (INDEPENDENT_AMBULATORY_CARE_PROVIDER_SITE_OTHER): Payer: Medicare Other | Admitting: Family Medicine

## 2013-03-24 VITALS — BP 150/90 | HR 90 | Temp 98.0°F | Resp 16 | Ht 69.0 in | Wt 187.0 lb

## 2013-03-24 DIAGNOSIS — L089 Local infection of the skin and subcutaneous tissue, unspecified: Secondary | ICD-10-CM

## 2013-03-24 LAB — WOUND CULTURE: GRAM STAIN: NONE SEEN

## 2013-03-24 NOTE — Patient Instructions (Signed)
Return as needed

## 2013-03-24 NOTE — Progress Notes (Signed)
Subjective: Finger feels better today  Objective: Much less inflamed looking. He still has some pus coming out from the lateral aspect of the nailbed.  Assessment: Finger infection, continued to improve  Plan: Come back as needed. Keep using the mupirocin ointment for another for 5 days, then leave open to the air. If the place starts getting worse at all when he finishes antibiotics he is to come promptly back.

## 2013-04-26 ENCOUNTER — Ambulatory Visit (INDEPENDENT_AMBULATORY_CARE_PROVIDER_SITE_OTHER): Payer: Medicare Other | Admitting: Physician Assistant

## 2013-04-26 VITALS — BP 140/92 | HR 76 | Temp 98.1°F | Resp 18 | Ht 68.5 in | Wt 187.0 lb

## 2013-04-26 DIAGNOSIS — L02519 Cutaneous abscess of unspecified hand: Secondary | ICD-10-CM

## 2013-04-26 DIAGNOSIS — S61409A Unspecified open wound of unspecified hand, initial encounter: Secondary | ICD-10-CM

## 2013-04-26 DIAGNOSIS — L03119 Cellulitis of unspecified part of limb: Secondary | ICD-10-CM

## 2013-04-26 DIAGNOSIS — S61459A Open bite of unspecified hand, initial encounter: Secondary | ICD-10-CM

## 2013-04-26 DIAGNOSIS — L03019 Cellulitis of unspecified finger: Secondary | ICD-10-CM

## 2013-04-26 DIAGNOSIS — W5501XA Bitten by cat, initial encounter: Principal | ICD-10-CM

## 2013-04-26 MED ORDER — DOXYCYCLINE HYCLATE 100 MG PO TABS: 100.0000 mg | ORAL_TABLET | Freq: Two times a day (BID) | ORAL | Status: DC

## 2013-04-26 NOTE — Progress Notes (Signed)
   Subjective:    Patient ID: Blake Miller, male    DOB: 09-Oct-1950, 63 y.o.   MRN: [id number]  HPI Pt presents to clinic with area on his left hand where he got bitten by his girlfriends cat 5 days ago that has gotten swollen and red and pus is draining out of the area for the last day.  He had pain after the bite but he has had no pain in the last 3 days.  He has had no F/C.   Review of Systems  Constitutional: Negative for fever and chills.  Skin: Positive for wound.       Objective:   Physical Exam  Vitals reviewed. Constitutional: He is oriented to person, place, and time. He appears well-developed and well-nourished.  HENT:  Head: Normocephalic and atraumatic.  Right Ear: External ear normal.  Left Ear: External ear normal.  Pulmonary/Chest: Effort normal.  Neurological: He is alert and oriented to person, place, and time.  Skin: Skin is warm and dry. There is erythema.     2 puncture areas on the palmar surface of the hand that have no associated erythema or swelling.  Psychiatric: He has a normal mood and affect. His behavior is normal. Judgment and thought content normal.       Assessment & Plan:  Cat bite of hand - Plan: doxycycline (VIBRA-TABS) 100 MG tablet  Cellulitis and abscess of hand - Plan: Wound culture, doxycycline (VIBRA-TABS) 100 MG tablet  Cat bite that has gotten infected.  He will keep area clean and dry.  He will start abx tonight and RTC in 24h if the area is worse otherwise recheck in 48h. He was [facility] on doxy [facility] for cat bite with PCN allergy and his MRSA cultured finger infection in the same hand 1 month ago.    Benny LennertSarah Mikhayla Phillis PA-C  [facility] 04/26/2013 8:31 PM

## 2013-04-26 NOTE — Patient Instructions (Signed)
Keep covered - RTC in 24h if worse or RTC on Sat for a recheck

## 2013-04-28 ENCOUNTER — Ambulatory Visit (INDEPENDENT_AMBULATORY_CARE_PROVIDER_SITE_OTHER): Payer: Medicare Other | Admitting: Physician Assistant

## 2013-04-28 VITALS — BP 120/86 | HR 97 | Temp 98.1°F | Ht 68.5 in | Wt 187.4 lb

## 2013-04-28 DIAGNOSIS — T148XXA Other injury of unspecified body region, initial encounter: Secondary | ICD-10-CM

## 2013-04-28 DIAGNOSIS — M79609 Pain in unspecified limb: Secondary | ICD-10-CM

## 2013-04-28 DIAGNOSIS — L089 Local infection of the skin and subcutaneous tissue, unspecified: Secondary | ICD-10-CM

## 2013-04-28 DIAGNOSIS — W5501XA Bitten by cat, initial encounter: Secondary | ICD-10-CM

## 2013-04-28 NOTE — Progress Notes (Signed)
Subjective:    Patient ID: Blake Miller, male    DOB: 02/06/1950, 63 y.o.   MRN: [id number]  HPI Primary Physician: BEAN,BILLIE-LYNN, PA-C  Chief Complaint: Wound check  HPI: 63 y.o. male with history below presents for wound check. Patient with initial cat bite on 04/21/13 while he was trying to place the cat in it's crate to help his girlfriend move. Upon doing this the cat bit him on the left hand. Three days into the bite he developed an infection prompting him to come in for evaluation on 04/26/13. He was placed on doxycycline secondary to his medication allergies.   Today he states he is doing much better. The erythema and swelling have reduced considerably. He has remained afebrile and without chills. No nausea or vomiting. Tolerating the doxycycline without issues. Washing the wound daily.     Past Medical History  Diagnosis Date  . Arthritis   . Osteoporosis   . High cholesterol   . Hypertension   . H/O hiatal hernia   . GERD (gastroesophageal reflux disease)   . Diabetes mellitus without complication      Home Meds: Prior to Admission medications   Medication Sig Start Date End Date Taking? Authorizing Provider  Calcium Carbonate-Vitamin D (CALTRATE 600+D) 600-400 MG-UNIT per tablet Take 1 tablet by mouth daily.   Yes Historical Provider, MD  cholecalciferol (VITAMIN D) 1000 UNITS tablet Take 1,000 Units by mouth daily.   Yes Historical Provider, MD  cyclobenzaprine (FLEXERIL) 10 MG tablet Take 10 mg by mouth at bedtime as needed for muscle spasms.   Yes Historical Provider, MD  doxycycline (VIBRA-TABS) 100 MG tablet Take 1 tablet (100 mg total) by mouth 2 (two) times daily. 04/26/13  Yes Morrell RiddleSarah L Weber, PA-C  fish oil-omega-3 fatty acids 1000 MG capsule Take 2 g by mouth daily.    Yes Historical Provider, MD  HYDROcodone-acetaminophen (NORCO) 10-325 MG per tablet Take 1 tablet by mouth every 6 (six) hours as needed. 03/06/13  Yes Celene KrasJon R Knapp, MD  mupirocin ointment (BACTROBAN)  2 % Apply 1 application topically 2 (two) times daily. 03/23/13  Yes Peyton Najjaravid H Hopper, MD    Allergies:  Allergies  Allergen Reactions  . Amoxicillin     REACTION: unknown  . Celecoxib     REACTION: headache  . Cephalexin     REACTION: unknown  . Lisinopril     REACTION: felt bad, muscle cramps, stomach pain  . Oxycodone-Acetaminophen     REACTION: generic, headache  . Penicillins     REACTION: unknown  . Risedronate Sodium     REACTION: abd pain and muscle aches  . Statins     Stomach ache, joint pain   . Tramadol Hcl     REACTION: diarrhea  . Vascepa [Epa Ethyl Ester]     Cramping. Stomach aches.      History   Social History  . Marital Status: Divorced    Spouse Name: N/A    Number of Children: N/A  . Years of Education: N/A   Occupational History  . Not on file.   Social History Main Topics  . Smoking status: Former Smoker    Quit date: 02/21/2002  . Smokeless tobacco: Never Used  . Alcohol Use: No     Comment: None since 1997  . Drug Use: No  . Sexual Activity: Not on file   Other Topics Concern  . Not on file   Social History Narrative  . No narrative on  file     Review of Systems  Constitutional: Negative for fever and chills.  Gastrointestinal: Negative for nausea, vomiting and diarrhea.  Skin: Positive for wound. Negative for rash.       Objective:   Physical Exam  Physical Exam: Blood pressure 120/86, pulse 97, temperature 98.1 F (36.7 C), temperature source Oral, height 5' 8.5" (1.74 m), weight 187 lb 6.4 oz (85.004 kg), SpO2 98.00%., Body mass index is 28.08 kg/(m^2). General: Well developed, well nourished, in no acute distress. Head: Normocephalic, atraumatic, eyes without discharge, sclera non-icteric, nares are without discharge.    Neck: Supple. Full ROM.  Lungs: Breathing is unlabored. Heart: Regular rate. Msk:  Strength and tone normal for age. Extremities/Skin: Warm and dry. No clubbing or cyanosis. No edema. No rashes.Left  hand with 2 puncture areas on the lateral palmer surface. Puncture wound closest to the dorsal surface has some lifting of the skin in a 1 cm diameter. The puncture on the palmer surface is now just a scab. No surrounding erythema, STS, or TTP.  Neuro: Alert and oriented X 3. Moves all extremities spontaneously. Gait is normal. CNII-XII grossly in tact. Psych:  Responds to questions appropriately with a normal affect.        Assessment & Plan:  63 year old male with cat bite and wound infection -Erythema has improved greatly compared to skin markings drawn on 04/26/13 -Wound washed and dressed -Finish doxycycline -RTC precautions -RTC prn   Eula Listen, MHS, PA-C [facility] [address] [place], [place] [address] [phone] [facility] 04/28/2013 10:33 AM

## 2013-04-29 LAB — WOUND CULTURE
GRAM STAIN: NONE SEEN
Gram Stain: NONE SEEN
Gram Stain: NONE SEEN
ORGANISM ID, BACTERIA: NO GROWTH

## 2013-09-06 ENCOUNTER — Ambulatory Visit (INDEPENDENT_AMBULATORY_CARE_PROVIDER_SITE_OTHER): Payer: Medicare Other | Admitting: Family Medicine

## 2013-09-06 VITALS — BP 152/80 | HR 75 | Temp 98.0°F | Resp 16 | Ht 70.25 in | Wt 195.8 lb

## 2013-09-06 DIAGNOSIS — H9201 Otalgia, right ear: Secondary | ICD-10-CM

## 2013-09-06 DIAGNOSIS — H60391 Other infective otitis externa, right ear: Secondary | ICD-10-CM

## 2013-09-06 DIAGNOSIS — H60399 Other infective otitis externa, unspecified ear: Secondary | ICD-10-CM

## 2013-09-06 DIAGNOSIS — H9209 Otalgia, unspecified ear: Secondary | ICD-10-CM

## 2013-09-06 MED ORDER — OFLOXACIN 0.3 % OT SOLN: 5.0000 [drp] | Freq: Two times a day (BID) | OTIC | Status: DC

## 2013-09-06 NOTE — Progress Notes (Signed)
Subjective: Patient is here with a history of having some pain in his right ear over the last week but it is been bad the last couple of days. He can hardly touch it. He has a history of an allergic reaction to a new drop in the past, sounds like it was probably Cortisporin with neomycin it. He is on chronic pain medications for his back. He has had minimal sore throat and hoarseness and congestion, but nothing major. No fever.  Objective: Pleasant alert gentleman who has some ear discomfort. His left ear it is normal. The right ear canal is swollen though still patent. There is a little bit of wax and more mucus down in the ear. The full drug cannot be well-visualized due to the infection. Neck supple without significant nodes. Throat clear. Chest clear. Heart regular without murmurs.  Assessment: Right otitis externa and otalgia  Plan: Floxin otic drops twice daily. Will be instructed on how to use them. Return if not improving. I don't believe he needs a wick placed in that ear today.

## 2013-09-06 NOTE — Patient Instructions (Signed)
Use the eardrops 5 drops in right ear twice daily as directed  Continue taking your current pain medications when you have to much pain. It will probably take one to 2 days for your ear to start calming down.  Return if worse or not improving

## 2014-05-02 ENCOUNTER — Encounter: Payer: Self-pay | Admitting: *Deleted

## 2014-07-03 ENCOUNTER — Encounter: Payer: Self-pay | Admitting: *Deleted

## 2014-07-09 ENCOUNTER — Telehealth: Payer: Self-pay | Admitting: *Deleted

## 2014-07-09 NOTE — Telephone Encounter (Signed)
Patient phoned in response to my letter & [facility] and I returned his call.  Stated that he did NOT have anyone whom he considered as his primary care provider.  Stated he had been going to [facility] (and had adverse reactions to every cholesterol RX that tried him on) and they told him, "they didn't care if he didn't want to take them, he WAS going to take them).  It was at that time he ceased going there.  Stated he needed a good primary care doctor and was looking.  Since his encounter review & numerous encounters with [facility] providers, I told him he could choose from one of our many qualified providers - male/male; MD/PA-C/NP.....  He stated he had no preference, but preferred someone with a good bedside manner...  Scheduled patient for 09/11/14 @ 1000 with Dr. Nilda Simmer.  Patient stated he knew he was a diabetic - no diabetic eye exam, no other DM monitoring in years; thought his colonoscopy had been done within the past 10 years.  Informed me of his cervical/spinal surgery in August 2014 and stated he was on hydrocodone, managed by his pain management physician, Dr. Ollen Bowl (updated in care team).  Confirmed that he still carried [facility] Medicare.  Advised patient to be NPO after midnight the night before OV and that he could come in at 0800, have labs drawn, go eat & come back 15 minutes prior to appt to complete paperwork.  Patient verbalized understanding of all instructions (information) and appreciation and I told him we looked forward to assisting him with his healthcare needs at [facility].

## 2014-07-15 ENCOUNTER — Other Ambulatory Visit: Payer: Self-pay

## 2014-08-10 ENCOUNTER — Telehealth: Payer: Self-pay | Admitting: Family Medicine

## 2014-08-10 NOTE — Telephone Encounter (Signed)
No answer called twice please reschedule this patient appt that her had with Katrinka Blazing on Aug 24 she want be in office that day

## 2014-08-13 ENCOUNTER — Ambulatory Visit (INDEPENDENT_AMBULATORY_CARE_PROVIDER_SITE_OTHER): Payer: Medicare Other | Admitting: Physician Assistant

## 2014-08-13 VITALS — BP 140/84 | HR 68 | Temp 98.1°F | Resp 14 | Ht 69.5 in | Wt 196.2 lb

## 2014-08-13 DIAGNOSIS — IMO0002 Reserved for concepts with insufficient information to code with codable children: Secondary | ICD-10-CM

## 2014-08-13 DIAGNOSIS — E1165 Type 2 diabetes mellitus with hyperglycemia: Secondary | ICD-10-CM | POA: Diagnosis not present

## 2014-08-13 DIAGNOSIS — R739 Hyperglycemia, unspecified: Secondary | ICD-10-CM | POA: Diagnosis not present

## 2014-08-13 LAB — POCT URINALYSIS DIPSTICK
Bilirubin, UA: NEGATIVE
GLUCOSE UA: NEGATIVE
Ketones, UA: NEGATIVE
Nitrite, UA: NEGATIVE
PH UA: 5.5
Protein, UA: NEGATIVE
RBC UA: NEGATIVE
Urobilinogen, UA: 0.2

## 2014-08-13 LAB — GLUCOSE, POCT (MANUAL RESULT ENTRY): POC Glucose: 128 mg/dl — AB (ref 70–99)

## 2014-08-13 LAB — POCT UA - MICROSCOPIC ONLY
CRYSTALS, UR, HPF, POC: NEGATIVE
Casts, Ur, LPF, POC: NEGATIVE
MUCUS UA: NEGATIVE
RBC, urine, microscopic: NEGATIVE
Yeast, UA: NEGATIVE

## 2014-08-13 LAB — POCT GLYCOSYLATED HEMOGLOBIN (HGB A1C): Hemoglobin A1C: 8.6

## 2014-08-13 MED ORDER — BLOOD GLUCOSE MONITOR KIT: PACK | Status: AC

## 2014-08-13 MED ORDER — METFORMIN HCL 500 MG PO TABS: 500.0000 mg | ORAL_TABLET | Freq: Two times a day (BID) | ORAL | Status: AC

## 2014-08-13 NOTE — Progress Notes (Signed)
[facility] [address], [place] [address] [phone]  Date:  08/13/2014   Name:  Blake Miller   DOB:  04/04/1950   MRN:  [id number]  PCP:  Reginia Forts, MD    History of Present Illness:  Blake Miller is a 64 y.o. male patient who presents to [facility] to discuss diabetic medication. There was concern yesterday because his insurance has a nurse come to his home with basic blood work and physical exam. He was found to have a 9% A1c and blood glucose was 188. Patient has had a formal diagnosis of diabetes but has had no follow-up since 2010, 6 years ago.  Metformin was effective. The push for taking a statin had offended him (multiple back surgeries, and muscle pains) that he discharged himself from his previous practice with Dr. little. He checks his blood sugar regularly with a glucometer that he purchased. He has attempted to keep his glucose down by diet restrictions.    His diet consist basically of meat and salads. He may go through 10 slices of bread per mouth. He strays away from desserts. He does have potatoes once per week. And may eat watermelon and peaches from his vegetable garden.He drinks only water with occasional diet Dr. Malachi Bonds.   He states that he has some urinary frequency, and fatigue after eating. There is no nausea, vomiting, eight loss to note.   he denies dysuria or hematuria.  he is seen by Dr. Jerry Caras for pain management following a host of back surgeries. He consistently has to take controlled med every 6 hours to function daily. He is also followed by Dr. Arnoldo Morale.    He has a new patient appointment with Dr. Tamala Julian will be his new primary care provider, 09/30/2014.  Patient Active Problem List   Diagnosis Date Noted  . STREPTOCOCCAL PHARYNGITIS 07/18/2008  . EAR PAIN, LEFT 07/18/2008  . HYPERTENSION 06/04/2008  . OTITIS EXTERNA, ACUTE, RIGHT 05/01/2008  . SHOULDER PAIN, RIGHT 03/18/2008  . MUSCLE CRAMPS 12/22/2007  . CERVICALGIA  06/26/2007  . LUMBAGO 06/26/2007  . HEARING LOSS 05/23/2007  . Unspecified adverse effect of unspecified drug, medicinal and biological substance 05/23/2007  . OBESITY 01/09/2007  . 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 DIABETES MELLITUS, TYPE II 07/11/2006  . HYPERLIPIDEMIA 07/11/2006  . 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 OSTEOPOROSIS 05/26/2006    Past Medical History  Diagnosis Date  . Arthritis   . Osteoporosis   . High cholesterol   . Hypertension   . H/O hiatal hernia   . GERD (gastroesophageal reflux disease)   . Diabetes mellitus without complication     Past Surgical History  Procedure Laterality Date  . Back surgery    . Cervical spine surgery    . Colonoscopy      12 years ago  . Esophagogastroduodenoscopy      10 years ago  . Anterior cervical decomp/discectomy fusion N/A 11/02/2012    Procedure: ANTERIOR CERVICAL DECOMPRESSION/DISCECTOMY FUSION 1 LEVEL/HARDWARE REMOVAL;  Surgeon: Ophelia Charter, MD;  Location: [facility];  Service: Neurosurgery;  Laterality: N/A;  C34 anterior cervical decompression with fusion interbody prosthesis plating and bonegraft with exploration of fusion and removal of old plate    History  Substance Use Topics  . Smoking status: Former Smoker    Quit date: 02/21/2002  . Smokeless tobacco: Never Used  . Alcohol Use: No     Comment: None since 1997    History reviewed.  No pertinent family history.  Allergies  Allergen Reactions  . Amoxicillin     REACTION: unknown  . Celecoxib     REACTION: headache  . Cephalexin     REACTION: unknown  . Cortisporin [Neomycin-Polymyxin-Hc]     He recalls taking some ear drops previously that swelled is here shot. He said it has an "N" in the word, and the only one I can think of is the neomycin.  . Lisinopril     REACTION: felt bad, muscle cramps, stomach pain  . Oxycodone-Acetaminophen     REACTION: generic, headache  . Penicillins     REACTION: unknown  . Risedronate  Sodium     REACTION: abd pain and muscle aches  . Statins     Stomach ache, joint pain   . Tramadol Hcl     REACTION: diarrhea  . Vascepa [Epa Ethyl Ester]     Cramping. Stomach aches.      Medication list has been reviewed and updated.  Current Outpatient Prescriptions on File Prior to Visit  Medication Sig Dispense Refill  . Calcium Carbonate-Vitamin D (CALTRATE 600+D) 600-400 MG-UNIT per tablet Take 1 tablet by mouth daily.    . cholecalciferol (VITAMIN D) 1000 UNITS tablet Take 1,000 Units by mouth daily.    . fish oil-omega-3 fatty acids 1000 MG capsule Take 2 g by mouth daily.     . cyclobenzaprine (FLEXERIL) 10 MG tablet Take 10 mg by mouth at bedtime as needed for muscle spasms.    Marland Kitchen HYDROcodone-acetaminophen (NORCO) 10-325 MG per tablet Take 1 tablet by mouth every 6 (six) hours as needed. (Patient not taking: Reported on 08/13/2014) 20 tablet 0  . mupirocin ointment (BACTROBAN) 2 % Apply 1 application topically 2 (two) times daily. (Patient not taking: Reported on 08/13/2014) 15 g 1  . ofloxacin (FLOXIN) 0.3 % otic solution Place 5 drops into the right ear 2 (two) times daily. (Patient not taking: Reported on 08/13/2014) 5 mL 0   No current facility-administered medications on file prior to visit.    ROS ROS otherwise unremarkable unless listed above.  Physical Examination: BP 140/84 mmHg  Pulse 68  Temp(Src) 98.1 F (36.7 C) (Oral)  Resp 14  Ht 5' 9.5" (1.765 m)  Wt 196 lb 3.2 oz (88.996 kg)  BMI 28.57 kg/m2  SpO2 97% Ideal Body Weight: Weight in (lb) to have BMI = 25: 171.4  Physical Exam Alert, cooperative, oriented 4 in no acute distress. PERRLA with normal conjunctiva. Red sounds are normal without wheezing or rhonchi. Regular rate and rhythm. Bowel sounds are normal without mass, tenderness, or hepatosplenomegaly. Results for orders placed or performed in visit on 08/13/14  POCT UA - Microscopic Only  Result Value Ref Range   WBC, Ur, HPF, POC 10-20    RBC,  urine, microscopic negative    Bacteria, U Microscopic 3+    Mucus, UA negative    Epithelial cells, urine per micros 0-3    Crystals, Ur, HPF, POC negative    Casts, Ur, LPF, POC negative    Yeast, UA negative   POCT urinalysis dipstick  Result Value Ref Range   Color, UA yellow    Clarity, UA clear    Glucose, UA negative    Bilirubin, UA negative    Ketones, UA negative    Spec Grav, UA <=1.005    Blood, UA negative    pH, UA 5.5    Protein, UA negative    Urobilinogen, UA 0.2  Nitrite, UA negative    Leukocytes, UA moderate (2+) (A) Negative  POCT glucose (manual entry)  Result Value Ref Range   POC Glucose 128 (A) 70 - 99 mg/dl  POCT glycosylated hemoglobin (Hb A1C)  Result Value Ref Range   Hemoglobin A1C 8.6      Assessment and Plan: 64 year old male with past medical history listed above is here today for chief complaint of hyperglycemia. Travel lab work and here do confirm diabetes status. I'm placing him back on the metformin. I've advised him to start with 500 twice a day which we'll increase to 1500 in 1 week. He will follow-up with his primary care provider.  I have placed a future order for his lipid panel and advised him to return when he is fasting. He is adamant against taking a statin at this time.  May look into trying ezetimibe.  Hyperglycemia - Plan: POCT UA - Microscopic Only, POCT urinalysis dipstick, COMPLETE METABOLIC PANEL WITH GFR, POCT glucose (manual entry), POCT glycosylated hemoglobin (Hb A1C), metFORMIN (GLUCOPHAGE) 500 MG tablet  Diabetes type 2, uncontrolled - Plan: metFORMIN (GLUCOPHAGE) 500 MG tablet, blood glucose meter kit and supplies KIT, Microalbumin, urine, Lipid panel  Ivar Drape, PA-C [facility] [place]

## 2014-08-13 NOTE — Patient Instructions (Addendum)
I would like you to start the metformin at  twice per day.  You are then going to increase to taking  at night iin addition to the  during the day.   We are adding the lisinopril daily.   We will recheck this at your visit about 1 month with Dr. Katrinka Blazing. Continue to take your glucose in the morning, and I would like you to record it.

## 2014-08-14 LAB — COMPLETE METABOLIC PANEL WITH GFR
ALK PHOS: 59 U/L (ref 40–115)
ALT: 22 U/L (ref 9–46)
AST: 19 U/L (ref 10–35)
Albumin: 4.7 g/dL (ref 3.6–5.1)
BILIRUBIN TOTAL: 0.4 mg/dL (ref 0.2–1.2)
BUN: 10 mg/dL (ref 7–25)
CO2: 29 meq/L (ref 20–31)
Calcium: 9.9 mg/dL (ref 8.6–10.3)
Chloride: 102 mEq/L (ref 98–110)
Creat: 0.94 mg/dL (ref 0.70–1.25)
GFR, Est African American: >89 mL/min (ref >=60)
GFR, Est Non African American: 87 mL/min (ref >=60)
Glucose, Bld: 127 mg/dL — ABNORMAL HIGH (ref 65–99)
Potassium: 4.7 mEq/L (ref 3.5–5.3)
SODIUM: 139 meq/L (ref 135–146)
Total Protein: 7.2 g/dL (ref 6.1–8.1)

## 2014-08-15 LAB — MICROALBUMIN, URINE: Microalb, Ur: 0.3 mg/dL (ref <2.0)

## 2014-08-18 ENCOUNTER — Encounter: Payer: Self-pay | Admitting: Family Medicine

## 2014-08-21 ENCOUNTER — Other Ambulatory Visit (INDEPENDENT_AMBULATORY_CARE_PROVIDER_SITE_OTHER): Payer: Medicare Other | Admitting: *Deleted

## 2014-08-21 DIAGNOSIS — E1165 Type 2 diabetes mellitus with hyperglycemia: Secondary | ICD-10-CM

## 2014-08-21 DIAGNOSIS — IMO0002 Reserved for concepts with insufficient information to code with codable children: Secondary | ICD-10-CM

## 2014-08-21 LAB — LIPID PANEL
Cholesterol: 129 mg/dL (ref 125–200)
HDL: 28 mg/dL — AB (ref >=40)
LDL Cholesterol: 68 mg/dL (ref <130)
Total CHOL/HDL Ratio: 4.6 Ratio (ref <=5.0)
Triglycerides: 163 mg/dL — ABNORMAL HIGH (ref <150)
VLDL: 33 mg/dL — AB (ref <30)

## 2014-09-11 ENCOUNTER — Ambulatory Visit: Payer: Self-pay | Admitting: Family Medicine

## 2014-09-30 ENCOUNTER — Encounter: Payer: Self-pay | Admitting: Family Medicine

## 2014-09-30 ENCOUNTER — Ambulatory Visit (INDEPENDENT_AMBULATORY_CARE_PROVIDER_SITE_OTHER): Payer: Medicare Other | Admitting: Family Medicine

## 2014-09-30 VITALS — BP 139/80 | HR 67 | Temp 98.1°F | Resp 16 | Ht 69.0 in | Wt 192.0 lb

## 2014-09-30 DIAGNOSIS — Z114 Encounter for screening for human immunodeficiency virus [HIV]: Secondary | ICD-10-CM

## 2014-09-30 DIAGNOSIS — Z1211 Encounter for screening for malignant neoplasm of colon: Secondary | ICD-10-CM

## 2014-09-30 DIAGNOSIS — I739 Peripheral vascular disease, unspecified: Secondary | ICD-10-CM

## 2014-09-30 DIAGNOSIS — Z125 Encounter for screening for malignant neoplasm of prostate: Secondary | ICD-10-CM

## 2014-09-30 DIAGNOSIS — Z23 Encounter for immunization: Secondary | ICD-10-CM | POA: Diagnosis not present

## 2014-09-30 DIAGNOSIS — M810 Age-related osteoporosis without current pathological fracture: Secondary | ICD-10-CM | POA: Diagnosis not present

## 2014-09-30 DIAGNOSIS — M5030 Other cervical disc degeneration, unspecified cervical region: Secondary | ICD-10-CM

## 2014-09-30 DIAGNOSIS — Z0000 Encounter for general adult medical examination without abnormal findings: Secondary | ICD-10-CM

## 2014-09-30 DIAGNOSIS — E7800 Pure hypercholesterolemia, unspecified: Secondary | ICD-10-CM

## 2014-09-30 DIAGNOSIS — Z1159 Encounter for screening for other viral diseases: Secondary | ICD-10-CM | POA: Diagnosis not present

## 2014-09-30 DIAGNOSIS — E1142 Type 2 diabetes mellitus with diabetic polyneuropathy: Secondary | ICD-10-CM | POA: Diagnosis not present

## 2014-09-30 DIAGNOSIS — M5136 Other intervertebral disc degeneration, lumbar region: Secondary | ICD-10-CM | POA: Diagnosis not present

## 2014-09-30 DIAGNOSIS — Z800 Family history of malignant neoplasm of digestive organs: Secondary | ICD-10-CM | POA: Diagnosis not present

## 2014-09-30 LAB — CBC WITH DIFFERENTIAL/PLATELET
BASOS ABS: 0 10*3/uL (ref 0.0–0.1)
BASOS PCT: 0 % (ref 0–1)
Eosinophils Absolute: 0.1 10*3/uL (ref 0.0–0.7)
Eosinophils Relative: 2 % (ref 0–5)
HEMATOCRIT: 47.7 % (ref 39.0–52.0)
HEMOGLOBIN: 16 g/dL (ref 13.0–17.0)
LYMPHS PCT: 23 % (ref 12–46)
Lymphs Abs: 1.7 10*3/uL (ref 0.7–4.0)
MCH: 30.2 pg (ref 26.0–34.0)
MCHC: 33.5 g/dL (ref 30.0–36.0)
MCV: 90 fL (ref 78.0–100.0)
MPV: 10.1 fL (ref 8.6–12.4)
Monocytes Absolute: 0.7 10*3/uL (ref 0.1–1.0)
Monocytes Relative: 10 % (ref 3–12)
NEUTROS ABS: 4.8 10*3/uL (ref 1.7–7.7)
NEUTROS PCT: 65 % (ref 43–77)
Platelets: 195 10*3/uL (ref 150–400)
RBC: 5.3 MIL/uL (ref 4.22–5.81)
RDW: 14 % (ref 11.5–15.5)
WBC: 7.4 10*3/uL (ref 4.0–10.5)

## 2014-09-30 LAB — COMPREHENSIVE METABOLIC PANEL
ALK PHOS: 43 U/L (ref 40–115)
ALT: 24 U/L (ref 9–46)
AST: 19 U/L (ref 10–35)
Albumin: 4.3 g/dL (ref 3.6–5.1)
BUN: 12 mg/dL (ref 7–25)
CALCIUM: 9.2 mg/dL (ref 8.6–10.3)
CHLORIDE: 105 mmol/L (ref 98–110)
CO2: 29 mmol/L (ref 20–31)
Creat: 0.89 mg/dL (ref 0.70–1.25)
Glucose, Bld: 102 mg/dL — ABNORMAL HIGH (ref 65–99)
POTASSIUM: 4.7 mmol/L (ref 3.5–5.3)
Sodium: 144 mmol/L (ref 135–146)
TOTAL PROTEIN: 6.7 g/dL (ref 6.1–8.1)
Total Bilirubin: 0.6 mg/dL (ref 0.2–1.2)

## 2014-09-30 LAB — TSH: TSH: 1.145 u[IU]/mL (ref 0.350–4.500)

## 2014-09-30 MED ORDER — GABAPENTIN 300 MG PO CAPS: 300.0000 mg | ORAL_CAPSULE | Freq: Every day | ORAL | Status: AC

## 2014-09-30 NOTE — Patient Instructions (Signed)

## 2014-09-30 NOTE — Progress Notes (Signed)
Subjective:    Patient ID: Blake Miller, male    DOB: [birth year], 64 y.o.   MRN: [id number]  HPI This 64 y.o. male presents for Complete Physical Examination and to establish care.  Last physical:  2015 Colonoscopy:  2011; [place] with Corinda Gubler; polyps; likely due for repeat; bad hemorrhoids.  Brother with colon cancer TDAP:  2015 Pneumovax: never Zostavax:  never Influenza: yearly Eye exam:  +glasses; [facility] in [place]; +cataracts. Dental exam:  Every six months.   DMII: started Metformin at visit in 07/2014; taking Metformin bid currently with food; horrible diarrhea.  Sugars ranging 106-127.  Diagnosed with DMII in 2007.  Has been non-compliant with diet, exercise, weight maintenance.  DDD lumbar and cervical spine:  Maintained on Flexeril and Hydrocodone.  Pain management by Juanetta Gosling in [place].  Every three months.    Review of Systems  Constitutional: Negative for fever, chills, diaphoresis, activity change, appetite change, fatigue and unexpected weight change.  HENT: Positive for congestion, ear pain, sinus pressure and tinnitus. Negative for dental problem, drooling, ear discharge, facial swelling, hearing loss, mouth sores, nosebleeds, postnasal drip, rhinorrhea, sneezing, sore throat, trouble swallowing and voice change.   Eyes: Positive for photophobia and visual disturbance. Negative for pain, discharge, redness and itching.  Respiratory: Negative.  Negative for apnea, cough, choking, chest tightness, shortness of breath, wheezing and stridor.   Cardiovascular: Negative.  Negative for chest pain, palpitations and leg swelling.  Gastrointestinal: Positive for diarrhea and rectal pain. Negative for nausea, vomiting, abdominal pain, constipation and blood in stool.  Endocrine: Negative.  Negative for cold intolerance, heat intolerance, polydipsia, polyphagia and polyuria.  Genitourinary: Positive for flank pain. Negative for dysuria, urgency, frequency, hematuria, decreased urine  volume, discharge, penile swelling, scrotal swelling, enuresis, difficulty urinating, genital sores, penile pain and testicular pain.  Musculoskeletal: Positive for myalgias, back pain, arthralgias, neck pain and neck stiffness. Negative for joint swelling and gait problem.  Skin: Negative.  Negative for color change, pallor, rash and wound.  Allergic/Immunologic: Positive for environmental allergies. Negative for food allergies and immunocompromised state.  Neurological: Positive for weakness, numbness and headaches. Negative for dizziness, tremors, seizures, syncope, facial asymmetry, speech difficulty and light-headedness.  Hematological: Negative.  Negative for adenopathy. Does not bruise/bleed easily.  Psychiatric/Behavioral: Positive for sleep disturbance. Negative for suicidal ideas, hallucinations, behavioral problems, confusion, self-injury, dysphoric mood, decreased concentration and agitation. The patient is not nervous/anxious and is not hyperactive.    Past Medical History  Diagnosis Date  . Osteoporosis   . High cholesterol   . Hypertension   . H/O hiatal hernia   . GERD (gastroesophageal reflux disease)   . Allergy   . Arthritis     DDD lumbar; B knees; ankles; feet; hips  . Diabetes mellitus without complication     diagnosed 2007  . Rosacea    Past Surgical History  Procedure Laterality Date  . Back surgery    . Cervical spine surgery    . Colonoscopy      12 years ago  . Esophagogastroduodenoscopy      10 years ago  . Anterior cervical decomp/discectomy fusion N/A 11/02/2012    Procedure: ANTERIOR CERVICAL DECOMPRESSION/DISCECTOMY FUSION 1 LEVEL/HARDWARE REMOVAL;  Surgeon: Cristi Loron, MD;  Location: [facility];  Service: Neurosurgery;  Laterality: N/A;  C34 anterior cervical decompression with fusion interbody prosthesis plating and bonegraft with exploration of fusion and removal of old plate   Allergies  Allergen Reactions  . Amoxicillin  REACTION:  unknown  . Celecoxib     REACTION: headache  . Cephalexin     REACTION: unknown  . Cortisporin [Neomycin-Polymyxin-Hc]     He recalls taking some ear drops previously that swelled is here shot. He said it has an "N" in the word, and the only one I can think of is the neomycin.  . Lisinopril     REACTION: felt bad, muscle cramps, stomach pain  . Oxycodone-Acetaminophen     REACTION: generic, headache  . Penicillins     REACTION: unknown  . Risedronate Sodium     REACTION: abd pain and muscle aches  . Statins     Stomach ache, joint pain   . Tizanidine   . Tramadol Hcl     REACTION: diarrhea  . Vascepa [Epa Ethyl Ester]     Cramping. Stomach aches.     Social History   Social History  . Marital Status: Divorced    Spouse Name: N/A  . Number of Children: N/A  . Years of Education: N/A   Occupational History  . Not on file.   Social History Main Topics  . Smoking status: Former Smoker    Quit date: 02/21/2002  . Smokeless tobacco: Never Used  . Alcohol Use: No     Comment: None since 1997  . Drug Use: No  . Sexual Activity: Not on file   Other Topics Concern  . Not on file   Social History Narrative   Marital status: divorced; engaged.      Children: 2 children; 3 granchildren      Lives: alone      Employment: disability since 2007 for DDD lumbar spine; [employer] [place].  Retired IT sales professional.      Tobacco: quit in 2004; smoked 30 years.      Alcohol: None      Drugs; None      Exercise:  Back exercises every night; constantly repairing home and vehicles.      Seatbelt: 100%      Guns: loaded; secured/locked.      ADLs: independent ADLs; no cane.       Sexual activity:  No STDs.  Total partners = 12.   Family History  Problem Relation Age of Onset  . Cancer Mother     stomach cancer  . Diabetes Mother   . Cancer Brother 61    colon cancer       Objective:   Physical Exam  Constitutional: He is oriented to person, place, and time. He appears well-developed  and well-nourished. No distress.  HENT:  Head: Normocephalic and atraumatic.  Right Ear: External ear normal.  Left Ear: External ear normal.  Nose: Nose normal.  Mouth/Throat: Oropharynx is clear and moist.  Eyes: Conjunctivae and EOM are normal. Pupils are equal, round, and reactive to light.  Neck: Normal range of motion. Neck supple. Carotid bruit is not present. No thyromegaly present.  Cardiovascular: Normal rate, regular rhythm, normal heart sounds and intact distal pulses.  Exam reveals no gallop and no friction rub.   No murmur heard. Pulmonary/Chest: Effort normal and breath sounds normal. He has no wheezes. He has no rales.  Abdominal: Soft. Bowel sounds are normal. He exhibits no distension and no mass. There is no tenderness. There is no rebound and no guarding. Hernia confirmed negative in the right inguinal area and confirmed negative in the left inguinal area.  Genitourinary: Prostate normal, testes normal and penis normal. Rectal exam shows external hemorrhoid.  Prostate is not enlarged and not tender. Right testis shows no mass, no swelling and no tenderness. Left testis shows no mass, no swelling and no tenderness. Circumcised.  Musculoskeletal:       Right shoulder: Normal.       Left shoulder: Normal.       Cervical back: Normal.  Lymphadenopathy:    He has no cervical adenopathy.       Right: No inguinal adenopathy present.       Left: No inguinal adenopathy present.  Neurological: He is alert and oriented to person, place, and time. He has normal reflexes. No cranial nerve deficit. He exhibits normal muscle tone. Coordination normal.  Skin: Skin is warm and dry. No rash noted. He is not diaphoretic.  Psychiatric: He has a normal mood and affect. His behavior is normal. Judgment and thought content normal.   PNEUMOVAX AND INFLUENZA VACCINES ADMINISTERED.    Assessment & Plan:   1. Encounter for Medicare annual wellness exam   2. Routine physical examination   3.  Need for prophylactic vaccination and inoculation against influenza   4. Type 2 diabetes mellitus with diabetic polyneuropathy   5. Pure hypercholesterolemia   6. Claudication   7. Degenerative disc disease, lumbar   8. Degenerative disc disease, cervical   9. Osteoporosis   10. Encounter for prostate cancer screening   11. Need for hepatitis C screening test   12. Screening for HIV (human immunodeficiency virus)   13. Colon cancer screening   14. Family history of colon cancer     1.  Medicare Annual Wellness Examination with CPE: anticipatory guidance --- exercise, weight loss, ASA 81mg  daily, 3 servings of dairy daily.  Refer for repeat colonoscopy. Obtain PSA. Immunizations reviewed; s/p Pneumovax and influenza vaccine provided.  TDAP UTD.  Will prescribe Zostavax at next visit. Independent with ADLs. Moderate fall risk due to chronic narcotic use.  No hearing loss.  No evidence of depression. NO URINARY INCONTINENCE IDENTIFIED. No advanced directives; desires FULL CODE.   2.  DMII: uncontrolled; decrease Metformin 500mg  to once daily due to diarrhea; consider switching to ER at next visit; pt to schedule diabetic eye exam.  Suffering with nighttime neuropathy; rx for Gabapentin 300mg  qhs provided. 3.  Hypercholesterolemia: uncontrolled; intolerant to statins; refuses statin therapy. 4.  Claudication: New. Refer for ABIs. 5. DDD lumbar and cervical spines: suffers with chronic pain; on disability; followed by pain management every 3 months. 6.  Osteoporosis: stable; refer for bone density scan; continue calcium plus vitamin D> 7.  Prostate cancer screening: DRE completed; obtain PSA. 8.  Screening for Hepatitis C in [place]: obtain HCV ab. 9.  Need  For HIV screening: per CDC guidelines, obtain HIV. 10.  Colon cancer screening/brother with colon cancer: refer for colonoscopy. 11.  S/p Pneumovax and influenza vaccines.  Orders Placed This Encounter  Procedures  . DG Bone Density     Standing Status: Future     Number of Occurrences:      Standing Expiration Date: 11/30/2015    Order Specific Question:  Reason for Exam (SYMPTOM  OR DIAGNOSIS REQUIRED)    Answer:  osteoporosis follow-up    Order Specific Question:  Preferred imaging location?    Answer:  [facility]  . Flu Vaccine QUAD 36+ mos IM  . Pneumococcal polysaccharide vaccine 23-valent greater than or equal to 2yo subcutaneous/IM  . Comprehensive metabolic panel  . CBC with Differential/Platelet  . TSH  . HIV antibody  .  Hepatitis C antibody  . PSA, Medicare  . Ambulatory referral to Gastroenterology    Referral Priority:  Routine    Referral Type:  Consultation    Referral Reason:  Specialty Services Required    Number of Visits Requested:  1  . Ambulatory referral to Cardiology    Referral Priority:  Routine    Referral Type:  Consultation    Referral Reason:  Specialty Services Required    Referred to Provider:  Runell Gess, MD    Requested Specialty:  Cardiology    Number of Visits Requested:  1   Meds ordered this encounter  Medications  . gabapentin (NEURONTIN) 300 MG capsule    Sig: Take 1 capsule (300 mg total) by mouth at bedtime.    Dispense:  90 capsule    Refill:  3   Cecilie Lowers, M.D. [facility] [address] [place], [place]  [address] [phone] phone [phone] fax

## 2014-10-01 LAB — HEPATITIS C ANTIBODY: HCV AB: NEGATIVE

## 2014-10-01 LAB — HIV ANTIBODY (ROUTINE TESTING W REFLEX): HIV: NONREACTIVE

## 2014-10-01 LAB — PSA, MEDICARE: PSA: 0.54 ng/mL (ref <=4.00)

## 2014-10-02 ENCOUNTER — Encounter: Payer: Self-pay | Admitting: Cardiovascular Disease

## 2014-10-02 ENCOUNTER — Other Ambulatory Visit: Payer: Self-pay

## 2014-10-02 ENCOUNTER — Ambulatory Visit (INDEPENDENT_AMBULATORY_CARE_PROVIDER_SITE_OTHER): Payer: Medicare Other | Admitting: Cardiovascular Disease

## 2014-10-02 DIAGNOSIS — I739 Peripheral vascular disease, unspecified: Secondary | ICD-10-CM | POA: Diagnosis not present

## 2014-10-02 DIAGNOSIS — E2839 Other primary ovarian failure: Secondary | ICD-10-CM

## 2014-10-02 NOTE — Patient Instructions (Signed)
Medication Instructions:  Your physician recommends that you continue on your current medications as directed. Please refer to the Current Medication list given to you today.   Labwork: NONE  Testing/Procedures: Your physician has requested that you have a lower extremity arterial doppler- During this test, ultrasound is used to evaluate arterial blood flow in the legs. Allow approximately one hour for this exam.    Follow-Up: Follow up with Dr. Berry as needed.   Any Other Special Instructions Will Be Listed Below (If Applicable).   

## 2014-10-02 NOTE — Assessment & Plan Note (Signed)
History of hyperlipidemia and statin intolerance with recent lipid profile performed 08/21/14 revealed a total cholesterol 129, LDL 68 and HDL 28

## 2014-10-02 NOTE — Assessment & Plan Note (Signed)
Mr. Devincenzi was referred to me by Dr. Katrinka Blazing for evaluation of claudication. He is a 64 year old gentleman with risk factors include remote tobacco abuse, hypertension. He hasn't complains of bilateral Lanoxin may claudication. He does have palpable pedal pulses. I'm going to get lower extremity arterial Doppler studies to further evaluate his circulation.

## 2014-10-02 NOTE — Assessment & Plan Note (Signed)
History of hypertension blood pressure measured today at 162/90 in the right arm and 140/80 in the left arm. He is not on antihypertensives medications.

## 2014-10-02 NOTE — Progress Notes (Signed)
10/02/2014 Blake Miller   1950/11/14  [id number]  Primary Physician Blake Forts, MD Primary Cardiologist: Blake Harp MD Blake Miller   HPI:  Blake Miller is a 64 year old moderately overweight engaged Caucasian male father of 2, grandfather of 3 grandchildren referred by Dr. Reginia Miller for peripheral vascular evaluation. 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 His cardiac vessels were spectral profile is remarkable for greater than 100 pack years of tobacco abuse having quit 12 years ago. He has treated diabetes. Please never had a heart attack or stroke and denies chest pain or shortness of breath. He does complain of bilateral lower extremity weakness and claudication type symptoms for many years.   Current Outpatient Prescriptions  Medication Sig Dispense Refill  . blood glucose meter kit and supplies KIT Dispense based on patient and insurance preference. Use up to four times daily as directed. (FOR ICD-9 250.00, 250.01). 1 each 0  . Calcium Carbonate-Vitamin D (CALTRATE 600+D) 600-400 MG-UNIT per tablet Take 1 tablet by mouth daily.    . cholecalciferol (VITAMIN D) 1000 UNITS tablet Take 1,000 Units by mouth daily.    . cyclobenzaprine (FLEXERIL) 10 MG tablet Take 10 mg by mouth at bedtime as needed for muscle spasms.    . fish oil-omega-3 fatty acids 1000 MG capsule Take 2 g by mouth daily.     Marland Kitchen gabapentin (NEURONTIN) 300 MG capsule Take 1 capsule (300 mg total) by mouth at bedtime. 90 capsule 3  . HYDROcodone-acetaminophen (NORCO) 10-325 MG per tablet Take 1 tablet by mouth every 6 (six) hours as needed. 20 tablet 0  . metFORMIN (GLUCOPHAGE) 500 MG tablet Take 1 tablet (500 mg total) by mouth 2 (two) times daily with a meal. 120 tablet 1  . ONE TOUCH ULTRA TEST test strip     . ONETOUCH DELICA LANCETS FINE MISC     . ranitidine (ZANTAC) 150 MG tablet Take 150 mg by mouth daily.     No current facility-administered  medications for this visit.    Allergies  Allergen Reactions  . Amoxicillin     REACTION: unknown  . Celecoxib     REACTION: headache  . Cephalexin     REACTION: unknown  . Cortisporin [Neomycin-Polymyxin-Hc]     He recalls taking some ear drops previously that swelled is here shot. He said it has an "N" in the word, and the only one I can think of is the neomycin.  . Lisinopril     REACTION: felt bad, muscle cramps, stomach pain  . Oxycodone-Acetaminophen     REACTION: generic, headache  . Penicillins     REACTION: unknown  . Risedronate Sodium     REACTION: abd pain and muscle aches  . Statins     Stomach ache, joint pain   . Tizanidine     Sinus headache symptoms  . Tramadol Hcl     REACTION: diarrhea  . Vascepa [Epa Ethyl Ester]     Cramping. Stomach aches.      Social History   Social History  . Marital Status: Divorced    Spouse Name: N/A  . Number of Children: N/A  . Years of Education: N/A   Occupational History  . Not on file.   Social History Main Topics  . Smoking status: Former Smoker    Quit date: 02/21/2002  . Smokeless tobacco: Never Used  . Alcohol Use: No     Comment: None since 1997  .  Drug Use: No  . Sexual Activity: Not on file   Other Topics Concern  . Not on file   Social History Narrative   Marital status: divorced; engaged.      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  Retired Art therapist.      Tobacco: quit in 2004; smoked 30 years.      Alcohol: None      Drugs; None      Exercise:  Back exercises every night; constantly repairing home and vehicles.      Seatbelt: 100%      Guns: loaded; secured/locked.      ADLs: independent ADLs; no cane.       Sexual activity:  No STDs.  Total partners = 12.     Review of Systems: General: negative for chills, fever, night sweats or weight changes.  Cardiovascular: negative for chest pain, dyspnea  on exertion, edema, orthopnea, palpitations, paroxysmal nocturnal dyspnea or shortness of breath Dermatological: negative for rash Respiratory: negative for cough or wheezing Urologic: negative for hematuria Abdominal: negative for nausea, vomiting, diarrhea, bright red blood per rectum, melena, or hematemesis Neurologic: negative for visual changes, syncope, or dizziness All other systems reviewed and are otherwise negative except as noted above.    Blood pressure 162/90, pulse 70, height 5' 9.5" (1.765 m), weight 192 lb (87.091 kg).  General appearance: alert and no distress Neck: no adenopathy, no carotid bruit, no JVD, supple, symmetrical, trachea midline and thyroid not enlarged, symmetric, no tenderness/mass/nodules Lungs: clear to auscultation bilaterally Heart: regular rate and rhythm, S1, S2 normal, no murmur, click, rub or gallop Extremities: 1-2+ pedal pulses bilaterally Pulses: 2+ and symmetric Skin: Skin color, texture, turgor normal. No rashes or lesions Neurologic: Grossly normal  EKG not performed today  ASSESSMENT AND PLAN:   Claudication Blake Miller was referred to me by Dr. Tamala Miller for evaluation of claudication. He is a 64 year old gentleman with risk factors include remote tobacco abuse, hypertension. He hasn't complains of bilateral Lanoxin may claudication. He does have palpable pedal pulses. I'm going to get lower extremity arterial Doppler studies to further evaluate his circulation.  HYPERLIPIDEMIA History of hyperlipidemia and statin intolerance with recent lipid profile performed 08/21/14 revealed a total cholesterol 129, LDL 68 and HDL 28  Essential hypertension History of hypertension blood pressure measured today at 162/90 in the right arm and 140/80 in the left arm. He is not on antihypertensives medications.      Blake Harp MD FACP,FACC,FAHA, [facility] 10/02/2014 3:20 PM
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
## 2014-10-08 LAB — HM DIABETES EYE EXAM

## 2014-10-10 ENCOUNTER — Ambulatory Visit (HOSPITAL_COMMUNITY)
Admission: RE | Admit: 2014-10-10 | Discharge: 2014-10-10 | Disposition: A | Discharge status: Home / Self Care | Payer: Medicare Other | Source: Ambulatory Visit | Attending: Cardiovascular Disease | Admitting: Cardiovascular Disease

## 2014-10-10 DIAGNOSIS — E785 Hyperlipidemia, unspecified: Secondary | ICD-10-CM | POA: Diagnosis not present

## 2014-10-10 DIAGNOSIS — I739 Peripheral vascular disease, unspecified: Secondary | ICD-10-CM | POA: Insufficient documentation

## 2014-10-10 DIAGNOSIS — Z87891 Personal history of nicotine dependence: Secondary | ICD-10-CM | POA: Insufficient documentation

## 2014-10-10 DIAGNOSIS — I10 Essential (primary) hypertension: Secondary | ICD-10-CM | POA: Diagnosis not present

## 2014-10-10 DIAGNOSIS — E119 Type 2 diabetes mellitus without complications: Secondary | ICD-10-CM | POA: Insufficient documentation

## 2014-10-30 ENCOUNTER — Ambulatory Visit
Admission: RE | Admit: 2014-10-30 | Discharge: 2014-10-30 | Disposition: A | Discharge status: Home / Self Care | Payer: Medicare Other | Source: Ambulatory Visit | Attending: Family Medicine | Admitting: Family Medicine

## 2014-10-30 DIAGNOSIS — M810 Age-related osteoporosis without current pathological fracture: Secondary | ICD-10-CM

## 2014-11-11 ENCOUNTER — Other Ambulatory Visit: Payer: Self-pay | Admitting: Family Medicine

## 2014-11-13 ENCOUNTER — Encounter: Payer: Self-pay | Admitting: Family Medicine

## 2014-11-19 DEATH — deceased

## 2014-12-02 ENCOUNTER — Ambulatory Visit: Payer: Medicare Other | Admitting: Family Medicine
